# Patient Record
Sex: Female | Born: 1985 | Race: White | Hispanic: No | Marital: Married | Smoking: Never smoker
Health system: Southern US, Community
[De-identification: ages and names within clinical notes are randomized; demographics above are authoritative.]

## PROBLEM LIST (undated history)

## (undated) ENCOUNTER — Inpatient Hospital Stay (HOSPITAL_COMMUNITY): Payer: Self-pay

## (undated) DIAGNOSIS — O139 Gestational [pregnancy-induced] hypertension without significant proteinuria, unspecified trimester: Secondary | ICD-10-CM

## (undated) DIAGNOSIS — Z862 Personal history of diseases of the blood and blood-forming organs and certain disorders involving the immune mechanism: Secondary | ICD-10-CM

## (undated) DIAGNOSIS — E041 Nontoxic single thyroid nodule: Secondary | ICD-10-CM

## (undated) DIAGNOSIS — R87629 Unspecified abnormal cytological findings in specimens from vagina: Secondary | ICD-10-CM

## (undated) DIAGNOSIS — Q210 Ventricular septal defect: Secondary | ICD-10-CM

## (undated) DIAGNOSIS — Z8639 Personal history of other endocrine, nutritional and metabolic disease: Secondary | ICD-10-CM

## (undated) HISTORY — DX: Personal history of diseases of the blood and blood-forming organs and certain disorders involving the immune mechanism: Z86.2

## (undated) HISTORY — PX: TONSILLECTOMY: SHX5217

## (undated) HISTORY — PX: ANTERIOR CRUCIATE LIGAMENT REPAIR: SHX115

## (undated) HISTORY — DX: Unspecified abnormal cytological findings in specimens from vagina: R87.629

## (undated) HISTORY — DX: Personal history of other endocrine, nutritional and metabolic disease: Z86.39

## (undated) HISTORY — DX: Ventricular septal defect: Q21.0

## (undated) HISTORY — DX: Gestational (pregnancy-induced) hypertension without significant proteinuria, unspecified trimester: O13.9

## (undated) HISTORY — DX: Nontoxic single thyroid nodule: E04.1

---

## 2004-10-09 ENCOUNTER — Ambulatory Visit (HOSPITAL_COMMUNITY): Admission: RE | Admit: 2004-10-09 | Discharge: 2004-10-09 | Payer: Self-pay | Admitting: Neurosurgery

## 2005-01-31 ENCOUNTER — Emergency Department (HOSPITAL_COMMUNITY): Admission: EM | Admit: 2005-01-31 | Discharge: 2005-01-31 | Payer: Self-pay | Admitting: Emergency Medicine

## 2005-09-28 ENCOUNTER — Ambulatory Visit (HOSPITAL_COMMUNITY): Admission: RE | Admit: 2005-09-28 | Discharge: 2005-09-28 | Payer: Self-pay | Admitting: Neurosurgery

## 2005-11-09 ENCOUNTER — Ambulatory Visit (HOSPITAL_COMMUNITY): Admission: RE | Admit: 2005-11-09 | Discharge: 2005-11-09 | Payer: Self-pay | Admitting: Orthopedic Surgery

## 2007-05-29 ENCOUNTER — Encounter: Admission: RE | Admit: 2007-05-29 | Discharge: 2007-05-29 | Payer: Self-pay | Admitting: Endocrinology

## 2007-05-29 ENCOUNTER — Encounter (INDEPENDENT_AMBULATORY_CARE_PROVIDER_SITE_OTHER): Payer: Self-pay | Admitting: Interventional Radiology

## 2007-05-29 ENCOUNTER — Other Ambulatory Visit: Admission: RE | Admit: 2007-05-29 | Discharge: 2007-05-29 | Payer: Self-pay | Admitting: Interventional Radiology

## 2008-08-13 DIAGNOSIS — Z862 Personal history of diseases of the blood and blood-forming organs and certain disorders involving the immune mechanism: Secondary | ICD-10-CM

## 2008-08-13 HISTORY — DX: Personal history of diseases of the blood and blood-forming organs and certain disorders involving the immune mechanism: Z86.2

## 2008-09-29 ENCOUNTER — Encounter (INDEPENDENT_AMBULATORY_CARE_PROVIDER_SITE_OTHER): Payer: Self-pay | Admitting: *Deleted

## 2008-10-28 ENCOUNTER — Ambulatory Visit: Payer: Self-pay | Admitting: Internal Medicine

## 2008-10-28 DIAGNOSIS — Q210 Ventricular septal defect: Secondary | ICD-10-CM

## 2008-10-28 DIAGNOSIS — E041 Nontoxic single thyroid nodule: Secondary | ICD-10-CM

## 2008-10-28 DIAGNOSIS — J069 Acute upper respiratory infection, unspecified: Secondary | ICD-10-CM | POA: Insufficient documentation

## 2008-10-28 DIAGNOSIS — Z8639 Personal history of other endocrine, nutritional and metabolic disease: Secondary | ICD-10-CM

## 2008-10-28 DIAGNOSIS — Z862 Personal history of diseases of the blood and blood-forming organs and certain disorders involving the immune mechanism: Secondary | ICD-10-CM

## 2008-10-28 HISTORY — DX: Nontoxic single thyroid nodule: E04.1

## 2008-11-01 ENCOUNTER — Ambulatory Visit: Payer: Self-pay | Admitting: Internal Medicine

## 2008-11-03 ENCOUNTER — Encounter: Admission: RE | Admit: 2008-11-03 | Discharge: 2008-11-03 | Payer: Self-pay | Admitting: Internal Medicine

## 2008-11-04 ENCOUNTER — Encounter (INDEPENDENT_AMBULATORY_CARE_PROVIDER_SITE_OTHER): Payer: Self-pay | Admitting: *Deleted

## 2008-11-04 ENCOUNTER — Telehealth (INDEPENDENT_AMBULATORY_CARE_PROVIDER_SITE_OTHER): Payer: Self-pay | Admitting: *Deleted

## 2008-11-04 LAB — CONVERTED CEMR LAB
Free T4: 0.8 ng/dL (ref 0.6–1.6)
TSH: 0.28 microintl units/mL — ABNORMAL LOW (ref 0.35–5.50)

## 2009-09-19 ENCOUNTER — Ambulatory Visit: Payer: Self-pay | Admitting: Internal Medicine

## 2009-09-19 DIAGNOSIS — R5383 Other fatigue: Secondary | ICD-10-CM

## 2009-09-19 DIAGNOSIS — R5381 Other malaise: Secondary | ICD-10-CM

## 2009-09-19 DIAGNOSIS — R0602 Shortness of breath: Secondary | ICD-10-CM

## 2009-09-20 LAB — CONVERTED CEMR LAB
ALT: 21 units/L (ref 0–35)
BUN: 9 mg/dL (ref 6–23)
Basophils Absolute: 0 10*3/uL (ref 0.0–0.1)
Bilirubin, Direct: 0.2 mg/dL (ref 0.0–0.3)
Creatinine, Ser: 0.8 mg/dL (ref 0.4–1.2)
EBV NA IgG: 2.02 — ABNORMAL HIGH
EBV VCA IgG: 6.55 — ABNORMAL HIGH
Eosinophils Relative: 0.2 % (ref 0.0–5.0)
GFR calc non Af Amer: 93.08 mL/min (ref >60)
Glucose, Bld: 95 mg/dL (ref 70–99)
Lymphs Abs: 2.2 10*3/uL (ref 0.7–4.0)
MCV: 92.9 fL (ref 78.0–100.0)
Monocytes Absolute: 0.8 10*3/uL (ref 0.1–1.0)
Monocytes Relative: 6.5 % (ref 3.0–12.0)
Neutrophils Relative %: 74.2 % (ref 43.0–77.0)
Platelets: 193 10*3/uL (ref 150.0–400.0)
RDW: 12.4 % (ref 11.5–14.6)
TSH: 0.45 microintl units/mL (ref 0.35–5.50)
Total Bilirubin: 0.9 mg/dL (ref 0.3–1.2)
WBC: 11.9 10*3/uL — ABNORMAL HIGH (ref 4.5–10.5)

## 2009-09-22 ENCOUNTER — Telehealth: Payer: Self-pay | Admitting: Internal Medicine

## 2010-02-20 ENCOUNTER — Ambulatory Visit: Payer: Self-pay | Admitting: Internal Medicine

## 2010-02-22 ENCOUNTER — Encounter (INDEPENDENT_AMBULATORY_CARE_PROVIDER_SITE_OTHER): Payer: Self-pay | Admitting: *Deleted

## 2010-09-03 ENCOUNTER — Encounter: Payer: Self-pay | Admitting: Endocrinology

## 2010-09-12 NOTE — Letter (Signed)
Summary: TB Skin Test  All     ,     Phone:   Fax:           TB Skin Test    Amy Brock    Date TB Test Placed:  Monday July 11th 2011  L Forearm  TB Test Placed by:  Margaret Pyle, CMA   Lot #:  [id number]        Expiration Date: 01/08/2012  Date TB Test Read: Wednesday July 13th 2011  Result < 5 MM  Negative  TB Test Read by:  Margaret Pyle, CMA

## 2010-09-12 NOTE — Assessment & Plan Note (Signed)
Summary: new - OK DR Aparna Vanderweele to add on/[facility]cd   Vital Signs:  Patient profile:   25 year old female Height:      66.75 inches (169.55 cm) Weight:      143.0 pounds (65 kg) BMI:     22.65 O2 Sat:      97 % on Room air Temp:     98.7 degrees F (37.06 degrees C) oral Pulse rate:   54 / minute BP sitting:   122 / 74  (right arm) Cuff size:   regular  Vitals Entered By: Orlan Leavens, RMA  O2 Flow:  Room air CC: New patient Is Patient Diabetic? No Pain Assessment Patient in pain? no        Primary Care Provider:  Newt Lukes MD  CC:  New patient.  History of Present Illness: new pt to me - prev followed by my colleuge dr. hopper at [facility]  here today with complaint of fatigue and persisting illness. onset of symptoms was approx 3 weeks ago. course has been gradual  onset and now occurs in constant pattern. problem precipitated by ST on MLK day - seen at [facility] - started on Amox for +rapid strep symptom characterized as fatigue and congestion in head - problem associated with dry cough and SOB (since being hit in anterior ribs playing soccer last week) but not associated with fever, weight changes, sweats; also noted red "blotching" various times of the day involving head/neck and chest and arms since becoming ill - no prior flushing hx symptoms improved by nothing - [facility] week and agin tested + rapid strep so now on omnicef + pred pak symptoms worsened with exertion. no prior hx of same symptoms - concerned she may have mono or pneumonai and wants to be tested.   also ?if thyroiditis could be flaring up.... hx same dx by endo with aspiration and bx but hads never f/u with endo since that time - no change in neck swelling - no trouble swallowing but feels ache from ears down to front of throat-  Clinical Review Panels:  Immunizations   Last Tetanus Booster:  Historical (08/13/2005)   Last Flu Vaccine:  Historical  (07/13/2008)   Current Medications (verified): 1)  Prednisone (Pak) 5 Mg Tabs (Prednisone) .... Take As Directed 2)  Proair Hfa 108 (90 Base) Mcg/act Aers (Albuterol Sulfate) .... Take 1 Puff Q 4 Hours 3)  Antibiotic .... Take 1 Two Times A Day  Allergies (verified): No Known Drug Allergies  Past History:  Past Medical History: Thyroid cyst ,S/P aspiration : Thyroiditis  MD rooster:  endo Talmage Nap  Past Surgical History: Reviewed history from 10/28/2008 and no changes required. 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  The patient denies anorexia, fever, weight loss, hoarseness, syncope, and abdominal pain.    Physical Exam  General:  alert, well-developed, well-nourished, and cooperative to examination.   mom at side Eyes:  vision grossly intact; pupils equal, round and reactive to light.  conjunctiva and lids normal.    Ears:  normal pinnae bilaterally, without erythema, swelling, or tenderness to palpation. TMs clear, without effusion, or cerumen impaction. Hearing grossly normal  bilaterally  Mouth:  teeth and gums in good repair; mucous membranes moist, without lesions or ulcers. oropharynx clear without exudate, min erythema. min clear PND Neck:  R thyroid enlarged & smooth ; L thyroid small & slightly irregular Lungs:  normal respiratory effort, no intercostal retractions or use of accessory muscles; normal breath sounds bilaterally - no crackles and no wheezes.    Heart:  normal rate, regular rhythm, no murmur, and no rub. BLE without edema. normal DP pulses and normal cap refill in all 4 extremities    Abdomen:  soft, non-tender, normal bowel sounds, no distention; no masses and no appreciable  hepatomegaly or splenomegaly.   Skin:  no bruising or rash along right ribs at area of trauma - progressive bright red  "splotching" with cont interview and exam at ant chest and neck -    Impression & Recommendations:  Problem # 1:  FATIGUE (ICD-780.79) ?persisting strep infx - mouth/OP fairly benign looking today -  also afeb and on braod spect abx so will not send for swab cx at this time but consider if recurrent symptoms - pt/parent concerned with mono of PNA - check labs and tests now - also ?relation to thyroid abn - check labs and refer back to endo for mgmt of same- if persiting symptoms , consdier need for other testing depending on these lab tests and course - rec completing abx and pred at this time as already begun Orders: TLB-BMP (Basic Metabolic Panel-BMET) (80048-METABOL) TLB-CBC Platelet - w/Differential (85025-CBCD) TLB-Hepatic/Liver Function Pnl (80076-HEPATIC) TLB-TSH (Thyroid Stimulating Hormone) (84443-TSH) T-Epstein Barr Virus Antibody Panel I ([id number]) T-2 View CXR (71020TC)  Problem # 2:  DYSPNEA (ICD-786.05) exam bening and vs/o2 ok -  suspect rib contusion from direct soccer trauma - but check CXR now Orders: TLB-BMP (Basic Metabolic Panel-BMET) (80048-METABOL) TLB-CBC Platelet - w/Differential (85025-CBCD) TLB-Hepatic/Liver Function Pnl (80076-HEPATIC) TLB-TSH (Thyroid Stimulating Hormone) (84443-TSH) T-Epstein Barr Virus Antibody Panel I ([id number]) T-2 View CXR (71020TC)  Problem # 3:  THYROID CYST (ICD-246.2)  Orders: TLB-BMP (Basic Metabolic Panel-BMET) (80048-METABOL) TLB-CBC Platelet - w/Differential (85025-CBCD) TLB-Hepatic/Liver Function Pnl (80076-HEPATIC) TLB-TSH (Thyroid Stimulating Hormone) (84443-TSH) T-Epstein Barr Virus Antibody Panel I ([id number]) T-2 View CXR (71020TC) Endocrinology Referral (Endocrine)  Complete Medication List: 1)  Prednisone (pak) 5 Mg Tabs (Prednisone) .... Take as directed 2)  Proair Hfa 108 (90  Base) Mcg/act Aers (Albuterol sulfate) .... Take 1 puff q 4 hours 3)  Antibiotic  .... Take 1 two times a day  Patient Instructions: 1)  it was good to see you today.  2)  test(s) ordered today - your results will be posted on the phone tree for review in 48-72 hours from the time of test completion; call [phone] and enter your 9 digit MRN (listed above on this page, just below your name); if any changes need to be made or there are abnormal results, you will be contacted directly.  3)  we'll make referral to dr. Talmage Nap for thyroid followup. Our office will contact you regarding this appointment once made.  4)  complete current medications as ongoing and as discussed - no changes recommended today 5)  Please schedule a follow-up appointment in 4-6 weeks if continued fatigue, sooner if problems.    Immunization History:  Tetanus/Td Immunization History:    Tetanus/Td:  historical (08/13/2005)  Influenza Immunization History:    Influenza:  historical (07/13/2008)

## 2010-09-12 NOTE — Progress Notes (Signed)
Summary: pt? re: pain  Phone Note Call from Patient Call back at Home Phone [phone]   Caller: Patient Summary of Call: pt called stating that she is still having back and chest pain although her labs all come back normal. pt is requesting advise from MD as to next step, possible referral? Initial call taken by: Margaret Pyle, CMA,  September 22, 2009 9:25 AM  Follow-up for Phone Call        i called pt and reviewed her symptoms by phone: continued right side chest and rib pain, esp with deep inspiration, not with movement - occ worse woith food -- pt concerned about abx upsetting stomach and about possible pleurisy - reviewed again neg CXR and labs - at this time, stop any remaining Omnicef (abx) and start Mobic once daily x 7d,,then as needed pain (e-rx done) - pt to call if cont symptoms despite this and will consider ?CT chest angio for further eval -- no need for specialist referral at this time - pt understands and agrees Follow-up by: Newt Lukes MD,  September 22, 2009 11:38 AM    New/Updated Medications: MOBIC 15 MG TABS (MELOXICAM) 1 by mouth once daily x 7 days, then as needed for pain Prescriptions: MOBIC 15 MG TABS (MELOXICAM) 1 by mouth once daily x 7 days, then as needed for pain  #30 x 0   Entered and Authorized by:   Newt Lukes MD   Signed by:   Newt Lukes MD on 09/22/2009   Method used:   Electronically to        [id number] (retail)       [address]       [place], [place]  [address]       Ph: [phone] or [phone]       Fax: [phone]   RxID:   [id number]

## 2010-09-12 NOTE — Assessment & Plan Note (Signed)
Summary: TB SKIN TEST--VL--STC  Nurse Visit   Allergies: No Known Drug Allergies  Immunizations Administered:  PPD Skin Test:    Vaccine Type: PPD    Site: left forearm    Mfr: Sanofi Pasteur    Dose: 0.1 ml    Route: ID    Given by: Margaret Pyle, CMA    Exp. Date: 01/08/2012    Lot #: [id number]  PPD Results    Date of reading: 02/22/2010    Results: < 5mm    Interpretation: negative  Orders Added: 1)  TB Skin Test [86580] 2)  Admin 1st Vaccine [45409]

## 2011-01-23 IMAGING — US US SOFT TISSUE HEAD/NECK
1 series · 14 of 25 positions shown · non-contrast
Comparison: To prior dictated report from ultrasound of the thyroid
from [HOSPITAL] dated 05/09/2007

CLINICAL DATA: Follow up of thyroid nodule

THYROID ULTRASOUND
TECHNIQUE: Ultrasound examination of the thyroid gland and
adjacent soft tissues was performed.

[Series 1: us soft tissue head/neck · 0.05mm/px · 14 of 51 slices shown]
[im 1/51]
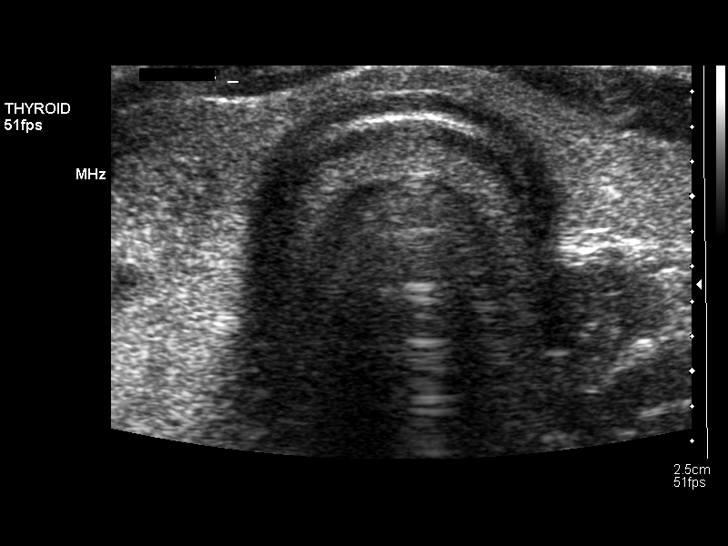
[im 5/51]
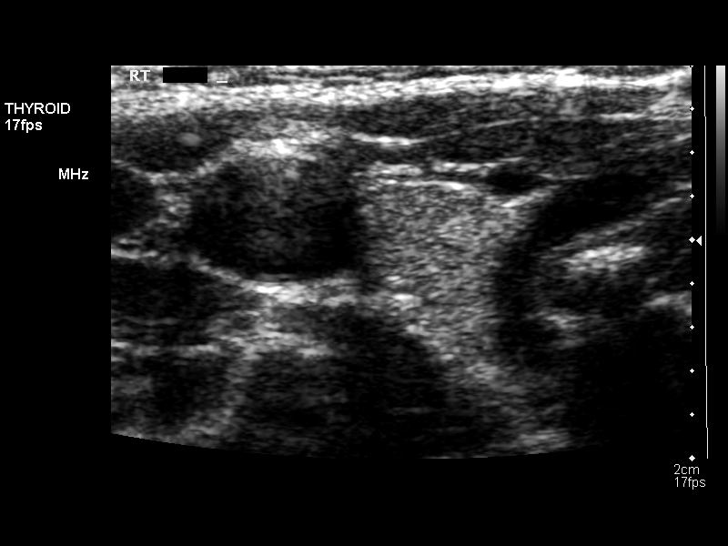
[im 9/51]
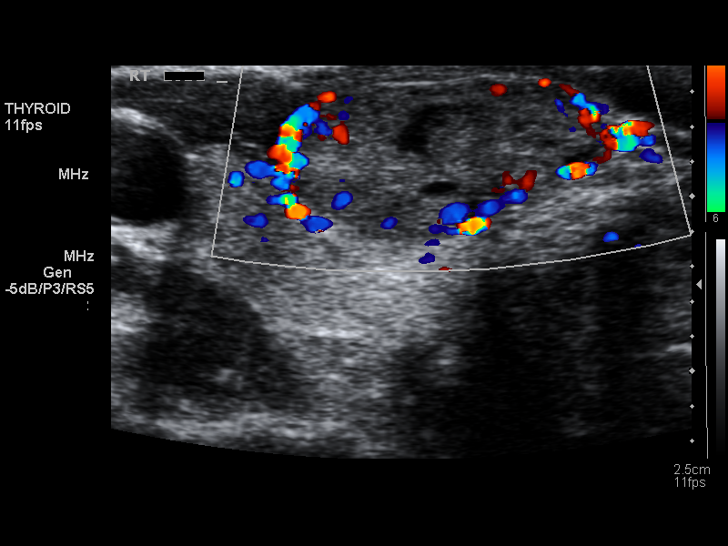
[im 13/51]
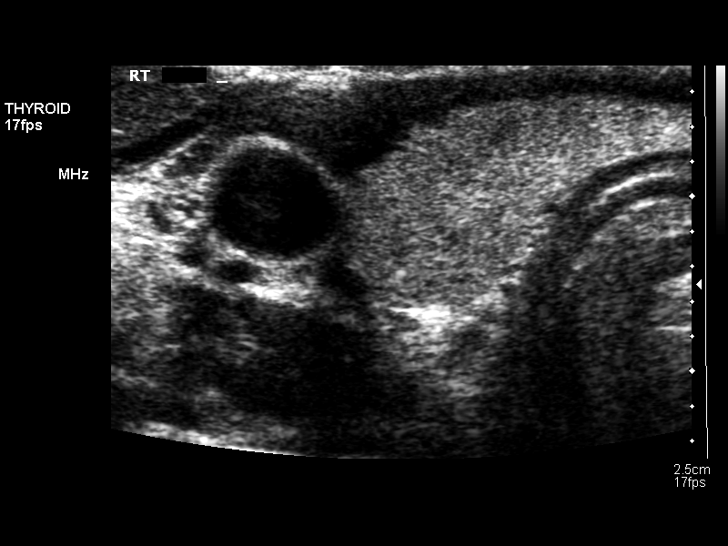
[im 17/51]
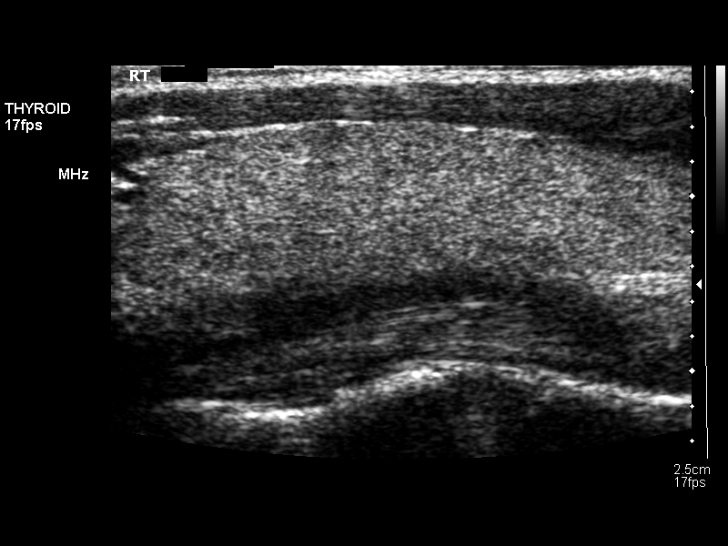
[im 19/51]
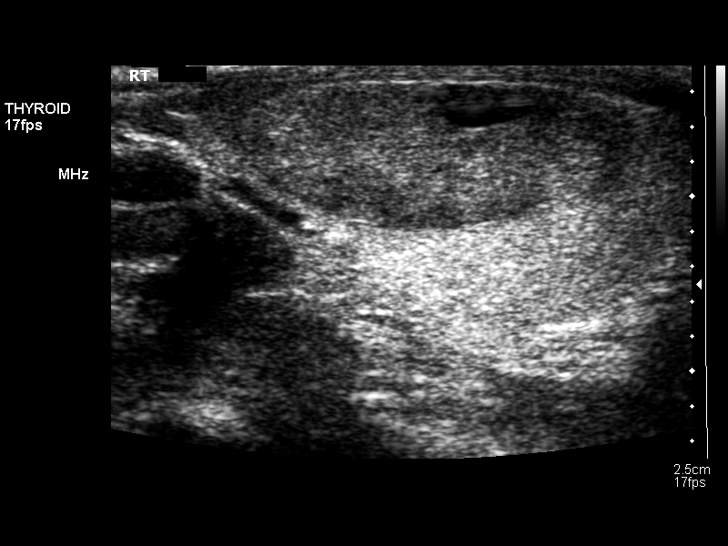
[im 23/51]
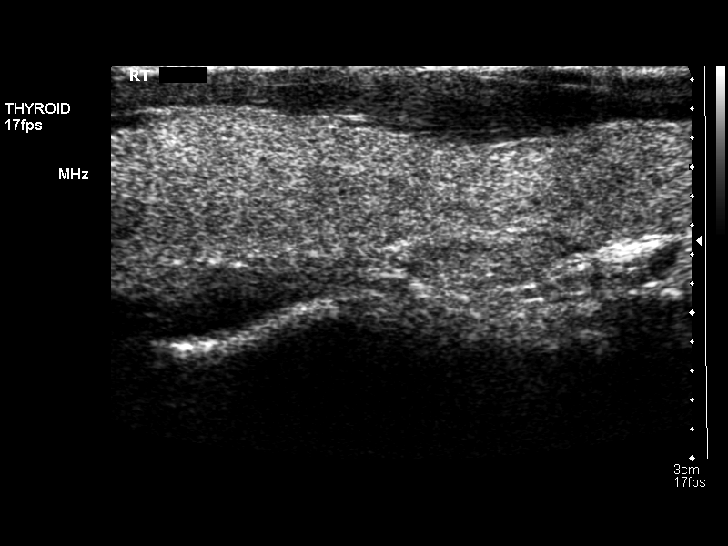
[im 28/51]
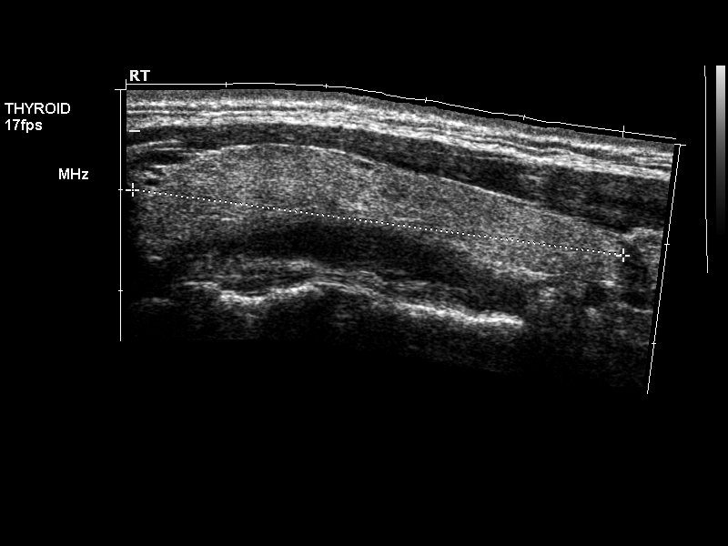
[im 32/51]
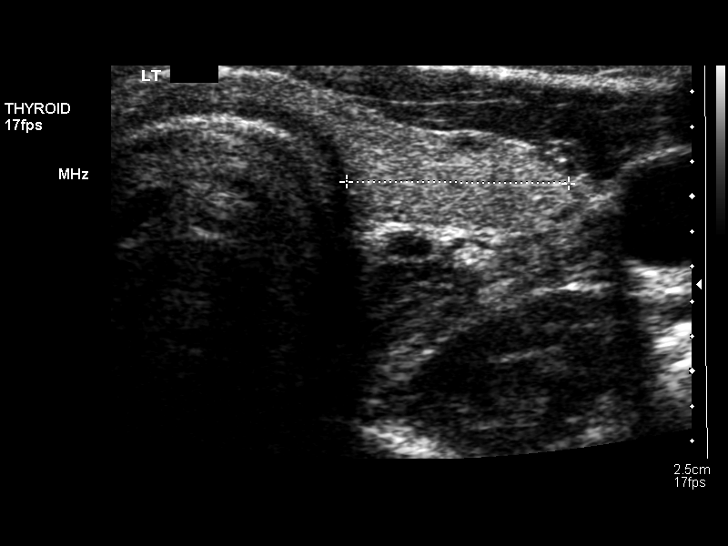
[im 34/51]
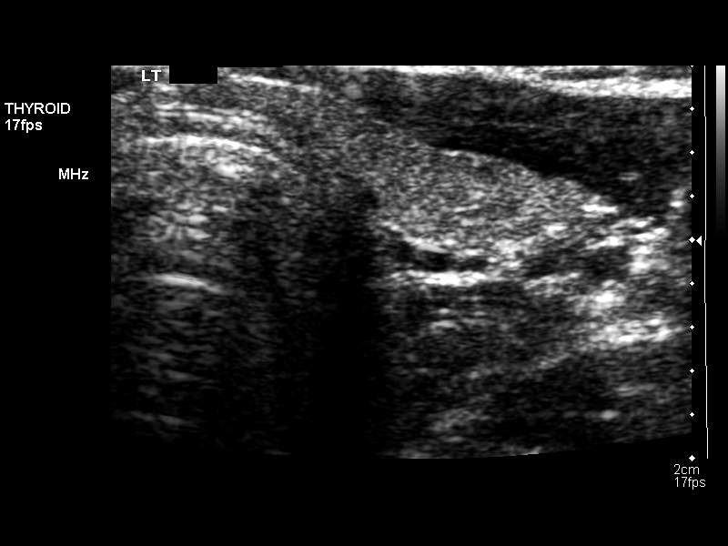
[im 38/51]
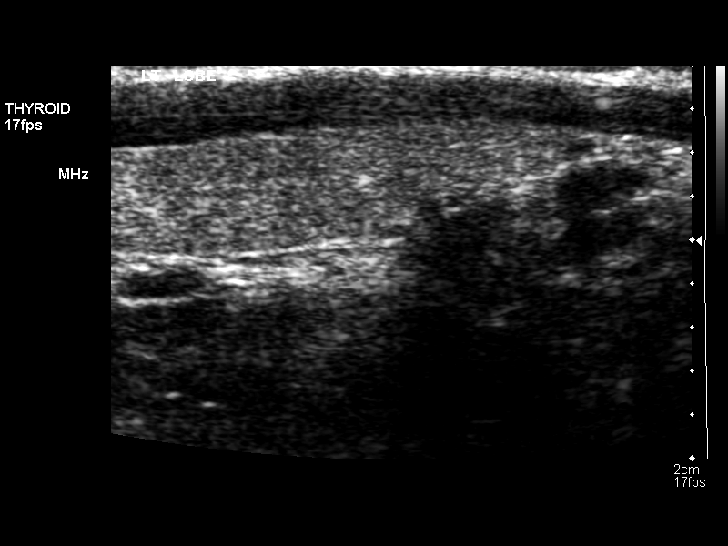
[im 42/51]
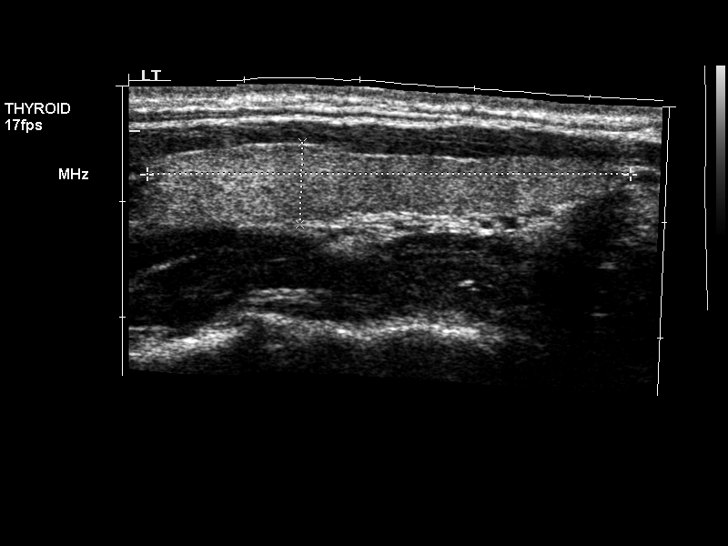
[im 46/51]
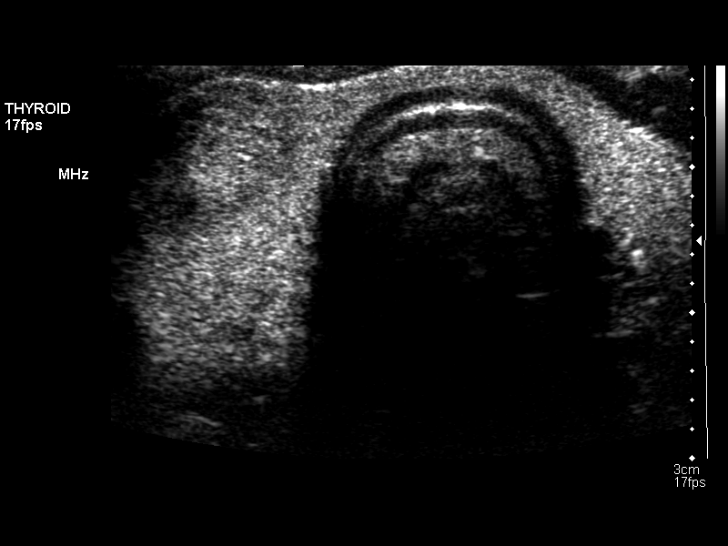
[im 51/51]
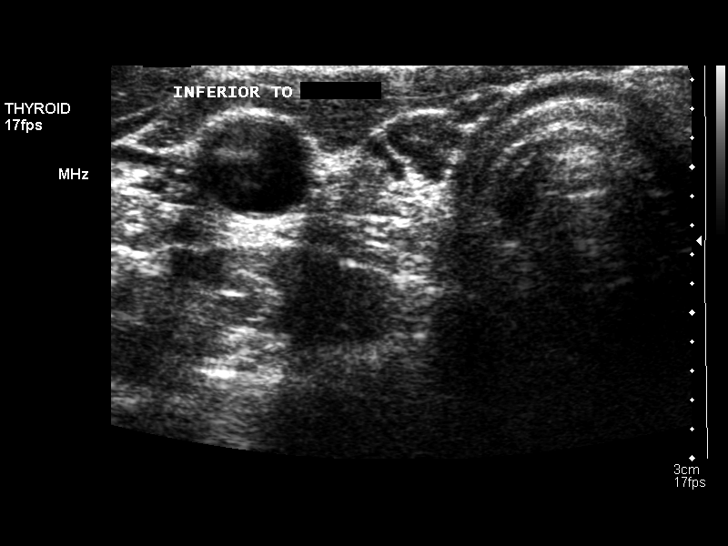

[14 of 25 positions shown; findings below may reference images not displayed]

FINDINGS: The thyroid gland is stable in size.  The right lobe
currently measures 4.9 x 1.0 x 1.9 cm, compared to prior
measurements of 4.9 x 1.2 x 2.0 cm.  The left lobe now measures
x 0.7 x 1.3 cm, compared to prior measurement of 4.4 x 0.7 x
cm.  The isthmus measures 1.8 mm in thickness.  The gland is
homogeneous in echogenicity.  A solid nodule is noted within the
right medial thyroid near the isthmus which is solid measuring
x 0.9 x 2.0 cm.  Prior measurements of this solid nodule were 1.8 x
1.0 x 2.3 cm.  Only a tiny 3 mm solid nodule is noted in the left
mid lobe.  A probable lymph node is noted just inferior to the
right lobe.
IMPRESSION: Stable solid nodule on the right of 2.4 x 0.9 x 2.0 cm.

## 2011-02-02 ENCOUNTER — Encounter: Payer: Self-pay | Admitting: Internal Medicine

## 2011-02-05 ENCOUNTER — Other Ambulatory Visit (INDEPENDENT_AMBULATORY_CARE_PROVIDER_SITE_OTHER): Payer: 59

## 2011-02-05 ENCOUNTER — Encounter: Payer: Self-pay | Admitting: Internal Medicine

## 2011-02-05 ENCOUNTER — Ambulatory Visit (INDEPENDENT_AMBULATORY_CARE_PROVIDER_SITE_OTHER): Payer: 59 | Admitting: Internal Medicine

## 2011-02-05 ENCOUNTER — Other Ambulatory Visit (INDEPENDENT_AMBULATORY_CARE_PROVIDER_SITE_OTHER): Payer: 59 | Admitting: Internal Medicine

## 2011-02-05 VITALS — BP 110/72 | HR 53 | Temp 98.5°F | Ht 66.75 in | Wt 145.0 lb

## 2011-02-05 DIAGNOSIS — Z0000 Encounter for general adult medical examination without abnormal findings: Secondary | ICD-10-CM

## 2011-02-05 DIAGNOSIS — N979 Female infertility, unspecified: Secondary | ICD-10-CM

## 2011-02-05 LAB — TSH: TSH: 0.97 u[IU]/mL (ref 0.35–5.50)

## 2011-02-05 LAB — LIPID PANEL
Cholesterol: 145 mg/dL (ref 0–200)
VLDL: 12.6 mg/dL (ref 0.0–40.0)

## 2011-02-05 LAB — BASIC METABOLIC PANEL
BUN: 10 mg/dL (ref 6–23)
Calcium: 8.6 mg/dL (ref 8.4–10.5)
Creatinine, Ser: 0.8 mg/dL (ref 0.4–1.2)
GFR: 90.75 mL/min (ref >60.00)
Glucose, Bld: 85 mg/dL (ref 70–99)

## 2011-02-05 LAB — URINALYSIS, ROUTINE W REFLEX MICROSCOPIC
Bilirubin Urine: NEGATIVE
Leukocytes, UA: NEGATIVE
Nitrite: NEGATIVE
pH: 6 (ref 5.0–8.0)

## 2011-02-05 LAB — CBC WITH DIFFERENTIAL/PLATELET
Basophils Absolute: 0 10*3/uL (ref 0.0–0.1)
Eosinophils Relative: 0.9 % (ref 0.0–5.0)
HCT: 36.4 % (ref 36.0–46.0)
Lymphs Abs: 1.1 10*3/uL (ref 0.7–4.0)
Monocytes Relative: 10.7 % (ref 3.0–12.0)
Neutrophils Relative %: 64.7 % (ref 43.0–77.0)
Platelets: 136 10*3/uL — ABNORMAL LOW (ref 150.0–400.0)
RDW: 13.2 % (ref 11.5–14.6)
WBC: 4.9 10*3/uL (ref 4.5–10.5)

## 2011-02-05 LAB — HEPATIC FUNCTION PANEL
ALT: 12 U/L (ref 0–35)
AST: 18 U/L (ref 0–37)
Alkaline Phosphatase: 37 U/L — ABNORMAL LOW (ref 39–117)
Bilirubin, Direct: 0.1 mg/dL (ref 0.0–0.3)
Total Bilirubin: 0.6 mg/dL (ref 0.3–1.2)
Total Protein: 6.8 g/dL (ref 6.0–8.3)

## 2011-02-05 MED ORDER — TUBERCULIN PPD 5 UNIT/0.1ML ID SOLN
5.0000 [IU] | Freq: Once | INTRADERMAL | Status: AC
  Administered 2011-02-05: 5 [IU] via INTRADERMAL

## 2011-02-05 NOTE — Patient Instructions (Signed)
It was good to see you today. We have reviewed your prior records including immunizations, labs and tests today Paperwork for school done today - will be ready for pick up Wednesday when you return for your PPD reading Test(s) ordered today for physical and AMH as requested. Your results will be given to you Wednesday (or called to you after review). If any changes need to be made, you will be notified at that time. Please schedule followup annually, call sooner if problems.

## 2011-02-05 NOTE — Progress Notes (Signed)
Subjective:    Patient ID: Amy Brock, female    DOB: May 25, 1986, 25 y.o.   MRN: [id number]  HPI patient is here today for annual physical. Patient feels well and has no complaints.  ?about best reproductive age - rec last week by gyn to check "AMH" level  Past Medical History  Diagnosis Date  . THYROIDITIS, HX OF 2010  . VSD     congenital, closed spont  . THYROID CYST 10/28/2008   Family History  Problem Relation Age of Onset  . Breast cancer Maternal Aunt   . Hypertension Maternal Grandmother     MI in 93's & CHF  . Hypertension Maternal Grandfather     ? MI @ 85  . Hypertension Paternal Grandfather     MI in 53's & CHF   History  Substance Use Topics  . Smoking status: Never Smoker   . 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 Alcohol Use: Yes     occassional    Review of Systems  Constitutional: Negative for fever.  Respiratory: Negative for cough and shortness of breath.   Cardiovascular: Negative for chest pain.  Gastrointestinal: Negative for abdominal pain.  Musculoskeletal: Negative for gait problem.  Skin: Negative for rash.  Neurological: Negative for dizziness.  No other specific complaints in a complete review of systems (except as listed in HPI above).     Objective:   Physical Exam BP 110/72  Pulse 53  Temp(Src) 98.5 F (36.9 C) (Oral)  Ht 5' 6.75" (1.695 m)  Wt 145 lb (65.772 kg)  BMI 22.88 kg/m2  SpO2 99% Wt Readings from Last 3 Encounters:  02/05/11 145 lb (65.772 kg)  09/19/09 143 lb (64.864 kg)  10/28/08 143 lb 6.4 oz (65.046 kg)    Physical Exam  Constitutional: She is oriented to person, place, and time. She appears well-developed and well-nourished. No distress.  HENT: Head: Normocephalic and atraumatic. Ears; B TMs ok, no erythema or effusion; Nose: Nose normal.  Mouth/Throat: Oropharynx is clear and moist. No oropharyngeal exudate.  Eyes: Conjunctivae and EOM are normal. Pupils are equal, round,  and reactive to light. No scleral icterus.  Neck: Normal range of motion. Neck supple. No JVD present. No thyromegaly present.  Cardiovascular: Normal rate, regular rhythm and normal heart sounds.  No murmur heard. No BLE edema. Pulmonary/Chest: Effort normal and breath sounds normal. No respiratory distress. She has no wheezes.  Abdominal: Soft. Bowel sounds are normal. She exhibits no distension. There is no tenderness.  Musculoskeletal: Normal range of motion, no joint effusions. No gross deformities Neurological: She is alert and oriented to person, place, and time. No cranial nerve deficit. Coordination normal.  Skin: Skin is warm and dry. No rash noted. No erythema.  Psychiatric: She has a normal mood and affect. Her behavior is normal. Judgment and thought content normal.       Lab Results  Component Value Date   WBC 11.9* 09/19/2009   HGB 13.4 09/19/2009   HCT 39.8 09/19/2009   PLT 193.0 09/19/2009   ALT 21 09/19/2009   AST 22 09/19/2009   NA 141 09/19/2009   K 3.6 09/19/2009   CL 106 09/19/2009   CREATININE 0.8 09/19/2009   BUN 9 09/19/2009   CO2 30 09/19/2009   TSH 0.45 09/19/2009    Assessment & Plan:  CPX - v70.0 - Patient has been counseled on age-appropriate routine health concerns for screening and prevention. These are reviewed and  up-to-date. Immunizations are up-to-date or declined. Labs ordered - will be reviewed. Paperwork for school (OT at [facility]) completed to pick up Wed s/p PPD reading  "infertility" - not yet trying to concienve but older sister with difficulty conceiving after age 26 - rec by gyn to check AMH (antimullerain hormone) to determine "best reproductive years"

## 2011-02-07 NOTE — Progress Notes (Signed)
Addended by: Anselm Jungling on: 02/07/2011 09:48 AM   Modules accepted: Orders

## 2011-12-09 IMAGING — CR DG CHEST 2V
2 series · 2 of 2 positions shown · non-contrast
Comparison: None

CLINICAL DATA: Chest pain, cough, shortness of breath

CHEST - 2 VIEW

[view not recorded (1 of 2)]
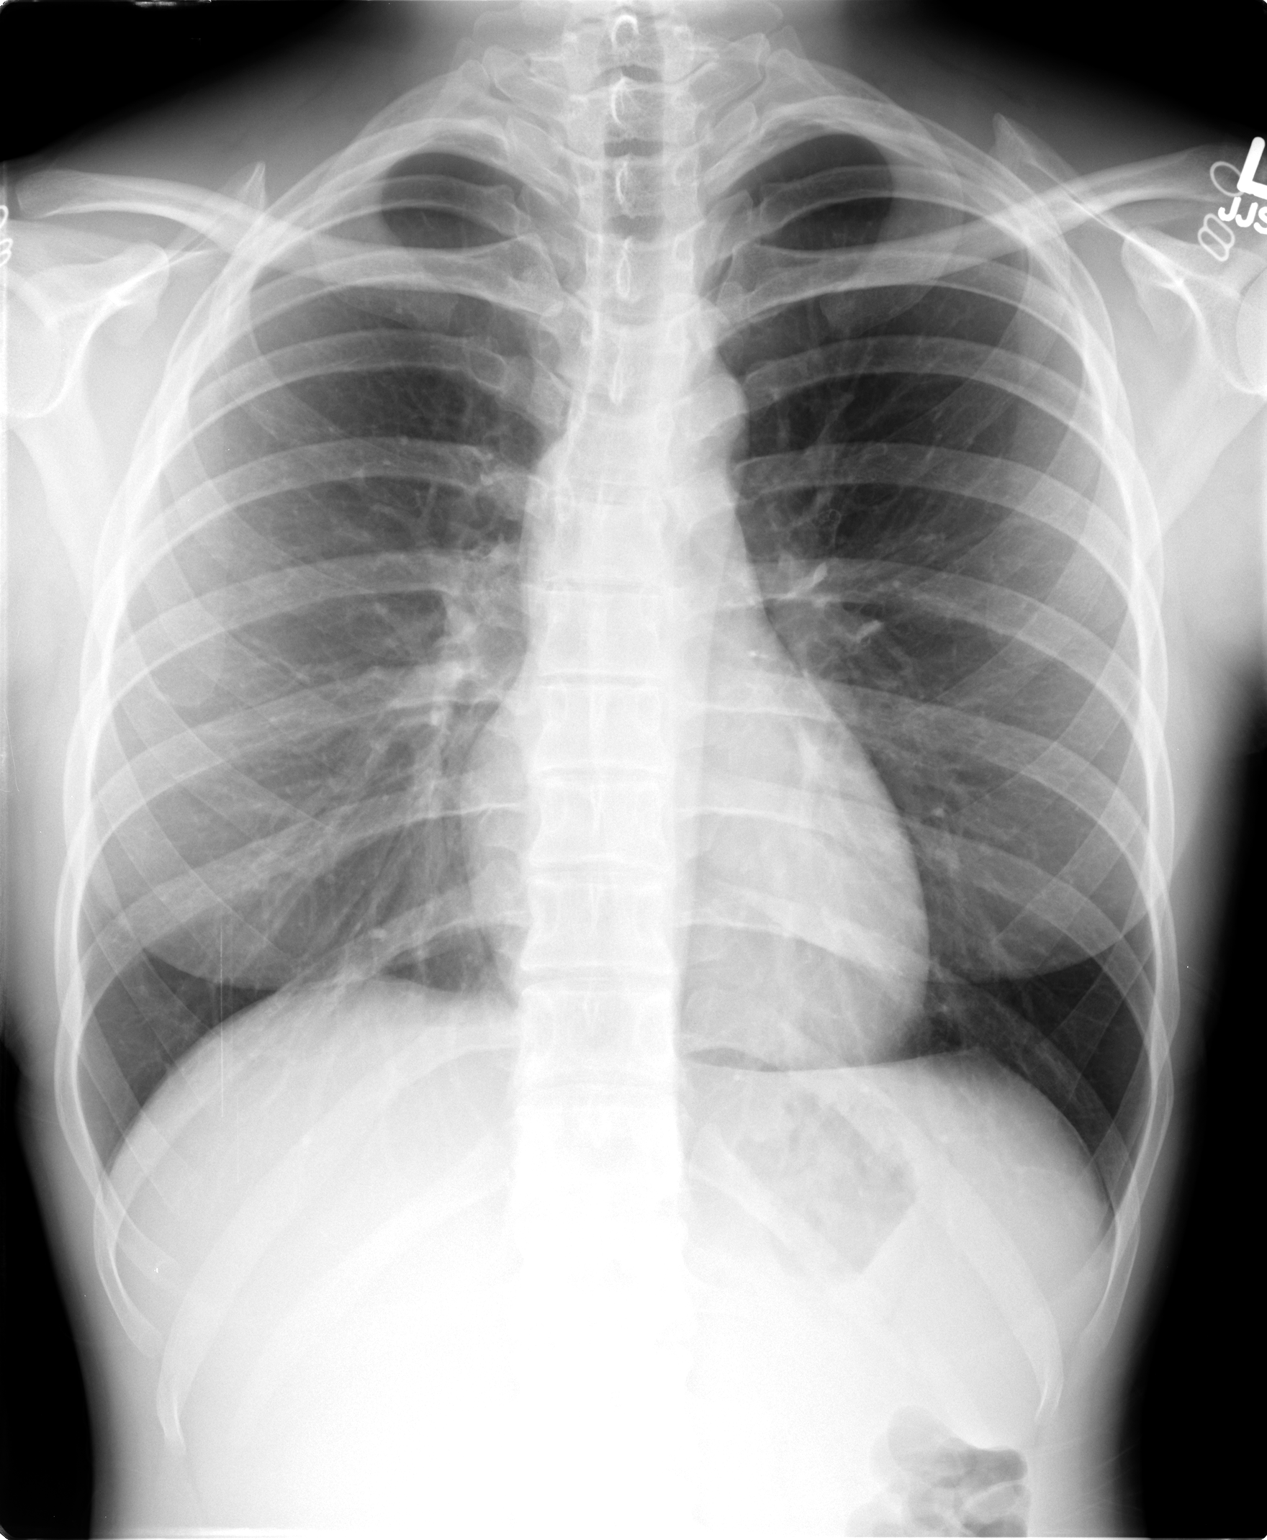

[view not recorded (2 of 2)]
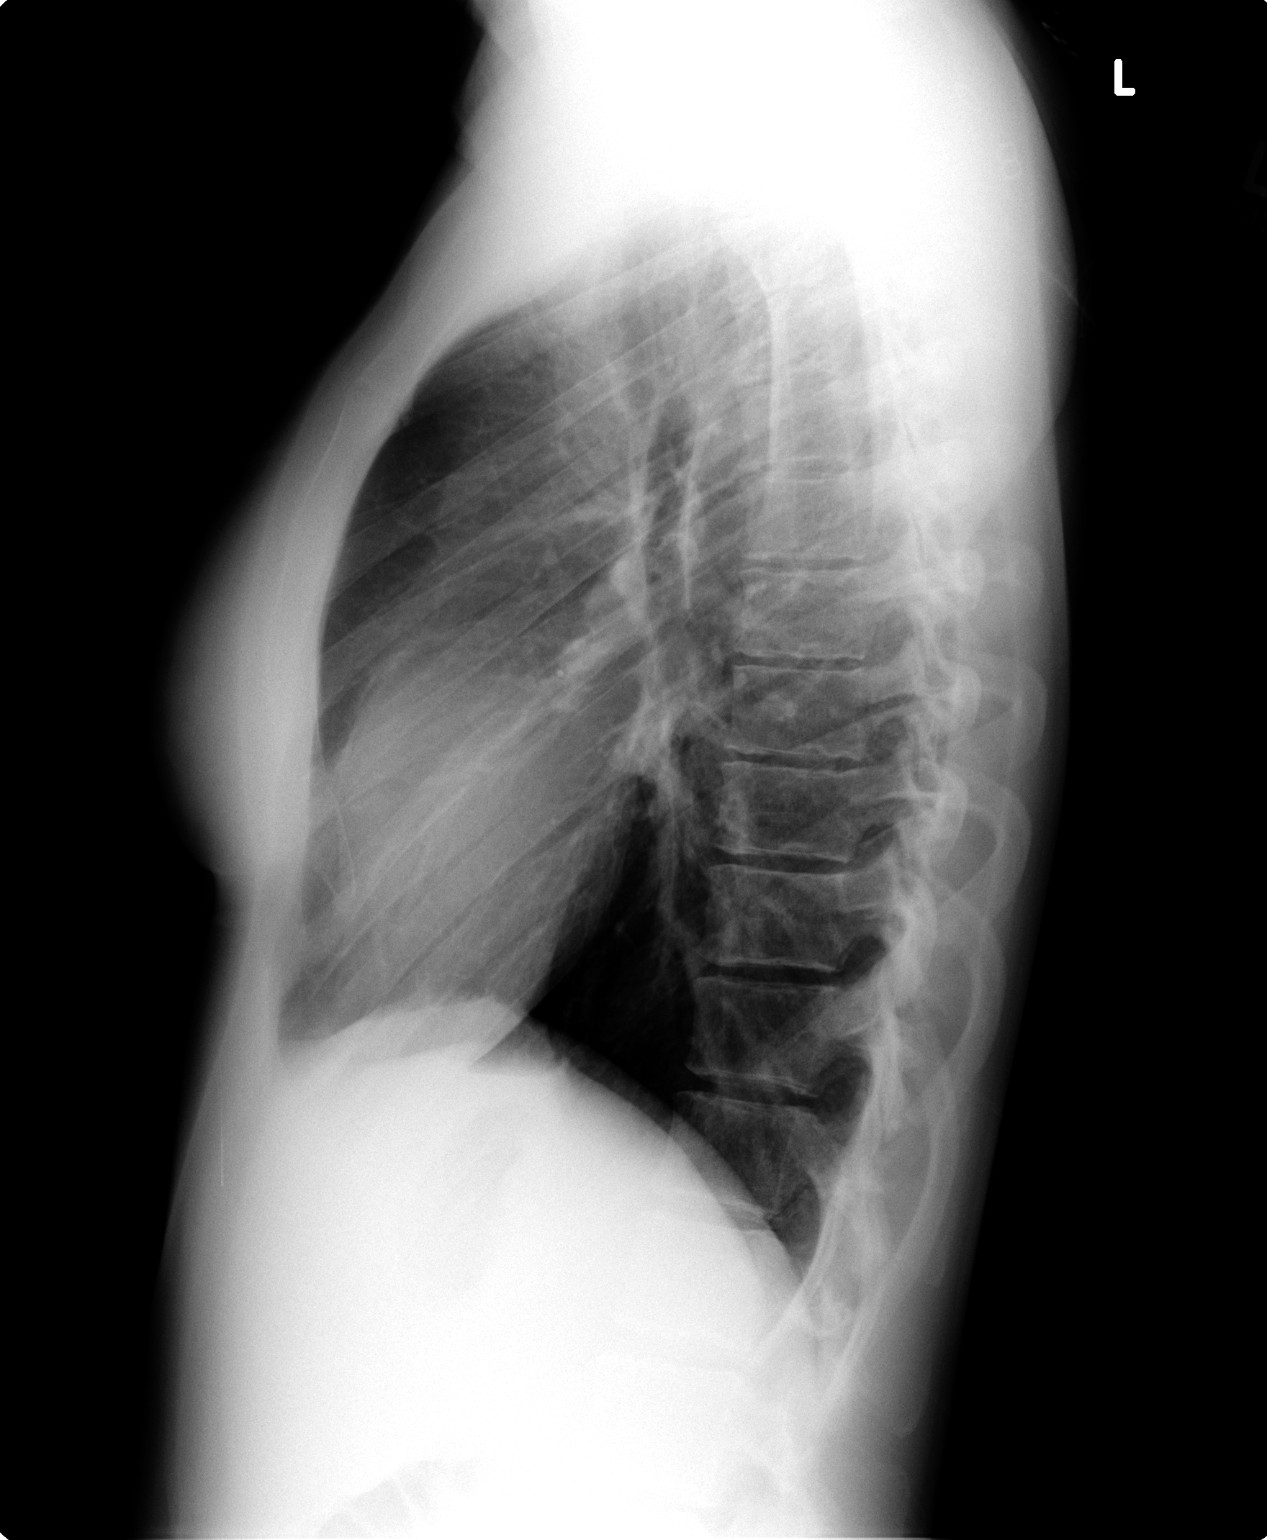

[2 of 2 positions shown; findings below may reference images not displayed]

FINDINGS: The lungs are clear.  Mediastinal contours are normal.
The heart is within normal limits in size.  No bony abnormality is
seen.
IMPRESSION: No active lung disease.

## 2012-04-10 ENCOUNTER — Other Ambulatory Visit: Payer: Self-pay | Admitting: Obstetrics and Gynecology

## 2013-04-22 ENCOUNTER — Other Ambulatory Visit: Payer: Self-pay | Admitting: Obstetrics and Gynecology

## 2014-02-09 LAB — OB RESULTS CONSOLE RUBELLA ANTIBODY, IGM: Rubella: IMMUNE

## 2014-02-09 LAB — OB RESULTS CONSOLE ABO/RH: RH TYPE: NEGATIVE

## 2014-02-09 LAB — OB RESULTS CONSOLE GC/CHLAMYDIA
Chlamydia: NEGATIVE
GC PROBE AMP, GENITAL: NEGATIVE

## 2014-02-09 LAB — OB RESULTS CONSOLE ANTIBODY SCREEN: Antibody Screen: NEGATIVE

## 2014-02-09 LAB — OB RESULTS CONSOLE HEPATITIS B SURFACE ANTIGEN: Hepatitis B Surface Ag: NEGATIVE

## 2014-02-09 LAB — OB RESULTS CONSOLE HIV ANTIBODY (ROUTINE TESTING): HIV: NONREACTIVE

## 2014-02-09 LAB — OB RESULTS CONSOLE RPR: RPR: NONREACTIVE

## 2014-02-22 ENCOUNTER — Other Ambulatory Visit: Payer: Self-pay | Admitting: Obstetrics and Gynecology

## 2014-02-23 LAB — CYTOLOGY - PAP

## 2014-06-13 ENCOUNTER — Inpatient Hospital Stay (HOSPITAL_COMMUNITY): Admission: AD | Admit: 2014-06-13 | Payer: 59 | Source: Ambulatory Visit | Admitting: Obstetrics and Gynecology

## 2014-08-16 LAB — OB RESULTS CONSOLE GBS: GBS: POSITIVE

## 2014-08-29 ENCOUNTER — Encounter (HOSPITAL_COMMUNITY): Payer: Self-pay | Admitting: *Deleted

## 2014-08-29 ENCOUNTER — Inpatient Hospital Stay (HOSPITAL_COMMUNITY)
Admission: AD | Admit: 2014-08-29 | Discharge: 2014-08-29 | Disposition: A | Discharge status: Home / Self Care | Payer: BLUE CROSS/BLUE SHIELD | Source: Ambulatory Visit | Attending: Obstetrics and Gynecology | Admitting: Obstetrics and Gynecology

## 2014-08-29 DIAGNOSIS — O9989 Other specified diseases and conditions complicating pregnancy, childbirth and the puerperium: Secondary | ICD-10-CM | POA: Diagnosis present

## 2014-08-29 DIAGNOSIS — N898 Other specified noninflammatory disorders of vagina: Secondary | ICD-10-CM | POA: Insufficient documentation

## 2014-08-29 DIAGNOSIS — Z3A38 38 weeks gestation of pregnancy: Secondary | ICD-10-CM | POA: Insufficient documentation

## 2014-08-29 LAB — URINE MICROSCOPIC-ADD ON

## 2014-08-29 LAB — URINALYSIS, ROUTINE W REFLEX MICROSCOPIC
BILIRUBIN URINE: NEGATIVE
GLUCOSE, UA: NEGATIVE mg/dL
HGB URINE DIPSTICK: NEGATIVE
Ketones, ur: NEGATIVE mg/dL
NITRITE: NEGATIVE
Protein, ur: NEGATIVE mg/dL
Specific Gravity, Urine: 1.01 (ref 1.005–1.030)
UROBILINOGEN UA: 0.2 mg/dL (ref 0.0–1.0)
pH: 6.5 (ref 5.0–8.0)

## 2014-08-29 LAB — POCT FERN TEST: POCT Fern Test: NEGATIVE

## 2014-08-29 NOTE — MAU Note (Signed)
Pt presents to MAU with complaints of clear leakage of fluid since yesterday. Reports that she was working in the home and it was hot in the home and couldn't determine if she was just sweaty. Denies any vaginal bleeding or cramping, some mild stabbing in her lower abdomen

## 2014-08-29 NOTE — MAU Provider Note (Signed)
Amy Brock is a 29 y.o. 5369w0d at 4469w0d. She had leaking yesterday- opaque at first, then clear, no odor. It has continued today. No bleeding.  SSE-  Ext gen- nl anatomy, skin intact Vagina- small amt creamy white discharge RN to read slide, do cervical exam

## 2014-08-29 NOTE — Discharge Instructions (Signed)

## 2014-09-08 ENCOUNTER — Inpatient Hospital Stay (HOSPITAL_COMMUNITY)
Admission: AD | Admit: 2014-09-08 | Discharge: 2014-09-08 | Disposition: A | Discharge status: Home / Self Care | Payer: BLUE CROSS/BLUE SHIELD | Source: Ambulatory Visit | Attending: Obstetrics and Gynecology | Admitting: Obstetrics and Gynecology

## 2014-09-08 ENCOUNTER — Encounter (HOSPITAL_COMMUNITY): Payer: Self-pay | Admitting: *Deleted

## 2014-09-08 DIAGNOSIS — R030 Elevated blood-pressure reading, without diagnosis of hypertension: Secondary | ICD-10-CM | POA: Diagnosis present

## 2014-09-08 DIAGNOSIS — Z3A39 39 weeks gestation of pregnancy: Secondary | ICD-10-CM | POA: Insufficient documentation

## 2014-09-08 DIAGNOSIS — O1203 Gestational edema, third trimester: Secondary | ICD-10-CM

## 2014-09-08 DIAGNOSIS — H538 Other visual disturbances: Secondary | ICD-10-CM

## 2014-09-08 DIAGNOSIS — O9989 Other specified diseases and conditions complicating pregnancy, childbirth and the puerperium: Secondary | ICD-10-CM | POA: Diagnosis not present

## 2014-09-08 LAB — URINALYSIS, ROUTINE W REFLEX MICROSCOPIC
BILIRUBIN URINE: NEGATIVE
GLUCOSE, UA: NEGATIVE mg/dL
Ketones, ur: NEGATIVE mg/dL
Nitrite: NEGATIVE
PROTEIN: NEGATIVE mg/dL
SPECIFIC GRAVITY, URINE: 1.015 (ref 1.005–1.030)
Urobilinogen, UA: 0.2 mg/dL (ref 0.0–1.0)
pH: 6 (ref 5.0–8.0)

## 2014-09-08 LAB — URIC ACID: URIC ACID, SERUM: 4.6 mg/dL (ref 2.4–7.0)

## 2014-09-08 LAB — PROTEIN / CREATININE RATIO, URINE
CREATININE, URINE: 60 mg/dL
Protein Creatinine Ratio: 0.17 — ABNORMAL HIGH (ref 0.00–0.15)
TOTAL PROTEIN, URINE: 10 mg/dL

## 2014-09-08 LAB — CBC
HCT: 35.2 % — ABNORMAL LOW (ref 36.0–46.0)
HEMOGLOBIN: 12.2 g/dL (ref 12.0–15.0)
MCH: 32.7 pg (ref 26.0–34.0)
MCHC: 34.7 g/dL (ref 30.0–36.0)
MCV: 94.4 fL (ref 78.0–100.0)
Platelets: 133 10*3/uL — ABNORMAL LOW (ref 150–400)
RBC: 3.73 MIL/uL — ABNORMAL LOW (ref 3.87–5.11)
RDW: 12.3 % (ref 11.5–15.5)
WBC: 11.6 10*3/uL — ABNORMAL HIGH (ref 4.0–10.5)

## 2014-09-08 LAB — COMPREHENSIVE METABOLIC PANEL
ALBUMIN: 2.9 g/dL — AB (ref 3.5–5.2)
ALT: 18 U/L (ref 0–35)
ANION GAP: 6 (ref 5–15)
AST: 22 U/L (ref 0–37)
Alkaline Phosphatase: 148 U/L — ABNORMAL HIGH (ref 39–117)
BUN: 9 mg/dL (ref 6–23)
CALCIUM: 8.6 mg/dL (ref 8.4–10.5)
CHLORIDE: 107 mmol/L (ref 96–112)
CO2: 21 mmol/L (ref 19–32)
Creatinine, Ser: 0.58 mg/dL (ref 0.50–1.10)
GFR calc Af Amer: >90 mL/min (ref >90)
GLUCOSE: 103 mg/dL — AB (ref 70–99)
POTASSIUM: 3.6 mmol/L (ref 3.5–5.1)
SODIUM: 134 mmol/L — AB (ref 135–145)
TOTAL PROTEIN: 6 g/dL (ref 6.0–8.3)
Total Bilirubin: 0.4 mg/dL (ref 0.3–1.2)

## 2014-09-08 LAB — URINE MICROSCOPIC-ADD ON

## 2014-09-08 NOTE — MAU Note (Signed)
Pt sent from MD office with new onset of elevated BP's today, slight HA with blurred vision, also dizzy.  Denies bleeding or LOF.

## 2014-09-08 NOTE — MAU Provider Note (Signed)
History     CSN: [id number]  Arrival date and time: 09/08/14 1317   None     Chief Complaint  Patient presents with  . Hypertension   HPI THis is a 29 y.o. female at 6464w3d who presents from office for evaluation of hypertension. She had been running low BPs and today was 135/88.  Has a mild headache and blurry vision. Has had edema for weeks. Good FM   OB History    Gravida Para Term Preterm AB TAB SAB Ectopic Multiple Living   1               Past Medical History  Diagnosis Date  . THYROIDITIS, HX OF 2010  . VSD     congenital, closed spont  . THYROID CYST 10/28/2008    Past Surgical History  Procedure Laterality Date  . Tonsillectomy      @ 11/ post op hemorrhage  . Anterior cruciate ligament repair  age 29    Family History  Problem Relation Age of Onset  . Breast cancer Maternal Aunt   . Hypertension Maternal Grandmother     MI in 8780's & CHF  . Hypertension Maternal Grandfather     ? MI @ 85  . Hypertension Paternal Grandfather     MI in 2080's & CHF    History  Substance Use Topics  . Smoking status: Never Smoker   . 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 Alcohol Use: Yes     Comment: occassional    Allergies: No Known Allergies  Prescriptions prior to admission  Medication Sig Dispense Refill Last Dose  . Prenatal Vit-Fe Fumarate-FA (PRENATAL MULTIVITAMIN) TABS tablet Take 1 tablet by mouth daily at 12 noon.   09/07/2014 at Unknown time    Review of Systems  Constitutional: Negative for fever, chills and malaise/fatigue.  Eyes: Positive for blurred vision.  Gastrointestinal: Negative for nausea, vomiting and abdominal pain.  Neurological: Positive for headaches (mild). Negative for dizziness and focal weakness.   Physical Exam   Last menstrual period 12/06/2013.  Physical Exam  Constitutional: She is oriented to person, place, and time. She appears well-developed and well-nourished.  HENT:  Head:  Normocephalic.  Cardiovascular: Normal rate, regular rhythm and normal heart sounds.  Exam reveals no gallop and no friction rub.   No murmur heard. Respiratory: Effort normal and breath sounds normal. No respiratory distress. She has no wheezes. She has no rales.  GI: Soft.  Musculoskeletal: Normal range of motion. She exhibits edema (Trace).  Neurological: She is alert and oriented to person, place, and time. She has normal reflexes. She displays normal reflexes. She exhibits normal muscle tone.  Skin: Skin is warm and dry.  Psychiatric: She has a normal mood and affect.   FHR reactive with good accels Irregular mild contractions  MAU Course  Procedures  MDM Filed Vitals:   09/08/14 1453 09/08/14 1503 09/08/14 1513 09/08/14 1630  BP: 133/81 128/74 131/67 128/77  Pulse: 61 57 73 65  Resp:    16   Results for Nelida MeuseBOYD, Jaleeya C (MRN [id number]) as of 09/13/2014 23:26  Ref. Range 09/08/2014 14:18  Sodium Latest Range: 135-145 mmol/L 134 (L)  Potassium Latest Range: 3.5-5.1 mmol/L 3.6  Chloride Latest Range: 96-112 mmol/L 107  CO2 Latest Range: 19-32 mmol/L 21  BUN Latest Range: 6-23 mg/dL 9  Creatinine Latest Range: 0.50-1.10 mg/dL 7.250.58  Calcium Latest Range: 8.4-10.5 mg/dL 8.6  GFR calc  non Af Amer Latest Range: >90 mL/min >90  GFR calc Af Amer Latest Range: >90 mL/min >90  Glucose Latest Range: 70-99 mg/dL 956 (H)  Anion gap Latest Range: 5-15  6  Alkaline Phosphatase Latest Range: 39-117 U/L 148 (H)  Albumin Latest Range: 3.5-5.2 g/dL 2.9 (L)  Uric Acid, Serum Latest Range: 2.4-7.0 mg/dL 4.6  AST Latest Range: 0-37 U/L 22  ALT Latest Range: 0-35 U/L 18  Total Protein Latest Range: 6.0-8.3 g/dL 6.0  Total Bilirubin Latest Range: 0.3-1.2 mg/dL 0.4  WBC Latest Range: 4.0-10.5 K/uL 11.6 (H)  RBC Latest Range: 3.87-5.11 MIL/uL 3.73 (L)  Hemoglobin Latest Range: 12.0-15.0 g/dL 21.3  HCT Latest Range: 36.0-46.0 % 35.2 (L)  MCV Latest Range: 78.0-100.0 fL 94.4  MCH Latest Range:  26.0-34.0 pg 32.7  MCHC Latest Range: 30.0-36.0 g/dL 08.6  RDW Latest Range: 11.5-15.5 % 12.3  Platelets Latest Range: 150-400 K/uL 133 (L)   Results for KAELEE, PFEFFER (MRN [id number]) as of 09/13/2014 23:26  Ref. Range 09/08/2014 13:50  Protein Latest Range: NEGATIVE mg/dL NEGATIVE   Results for VERSIE, SOAVE (MRN [id number]) as of 09/13/2014 23:26  Ref. Range 09/08/2014 13:50  Protein Creatinine Ratio Latest Range: 0.00-0.15  0.17 (H)   Assessment and Plan  A:  SIUP at [redacted]w[redacted]d      Labile blood pressures, no overt hypertension      Normal labs except for mild thrombocytopenia  P:  Discussed with Dr Henderson Cloud       Discharge home with [facility] precautions       Followup in office  [facility] 09/08/2014, 2:18 PM

## 2014-09-08 NOTE — Discharge Instructions (Signed)
Blurred Vision You have been seen today complaining of blurred vision. This means you have a loss of ability to see small details.  CAUSES  Blurred vision can be a symptom of underlying eye problems, such as:  Aging of the eye (presbyopia).  Glaucoma.  Cataracts.  Eye infection.  Eye-related migraine.  Diabetes mellitus.  Fatigue.  Migraine headaches.  High blood pressure.  Breakdown of the back of the eye (macular degeneration).  Problems caused by some medications. The most common cause of blurred vision is the need for eyeglasses or a new prescription. Today in the emergency department, no cause for your blurred vision can be found. SYMPTOMS  Blurred vision is the loss of visual sharpness and detail (acuity). DIAGNOSIS  Should blurred vision continue, you should see your caregiver. If your caregiver is your primary care physician, he or she may choose to refer you to another specialist.  TREATMENT  Do not ignore your blurred vision. Make sure to have it checked out to see if further treatment or referral is necessary. SEEK MEDICAL CARE IF:  You are unable to get into a specialist so we can help you with a referral. SEEK IMMEDIATE MEDICAL CARE IF: You have severe eye pain, severe headache, or sudden loss of vision. MAKE SURE YOU:   Understand these instructions.  Will watch your condition.  Will get help right away if you are not doing well or get worse. Document Released: 08/02/2003 Document Revised: 10/22/2011 Document Reviewed: 03/03/2008 [facility] Patient Information 2015 [employer] [place]. This information is not intended to replace advice given to you by your health care provider. Make sure you discuss any questions you have with your health care provider.  Third Trimester of Pregnancy The third trimester is from week 29 through week 42, months 7 through 9. The third trimester is a time when the fetus is growing rapidly. At the end of the ninth month, the fetus is  about 20 inches in length and weighs 6-10 pounds.  BODY CHANGES Your body goes through many changes during pregnancy. The changes vary from woman to woman.   Your weight will continue to increase. You can expect to gain 25-35 pounds (11-16 kg) by the end of the pregnancy.  You may begin to get stretch marks on your hips, abdomen, and breasts.  You may urinate more often because the fetus is moving lower into your pelvis and pressing on your bladder.  You may develop or continue to have heartburn as a result of your pregnancy.  You may develop constipation because certain hormones are causing the muscles that push waste through your intestines to slow down.  You may develop hemorrhoids or swollen, bulging veins (varicose veins).  You may have pelvic pain because of the weight gain and pregnancy hormones relaxing your joints between the bones in your pelvis. Backaches may result from overexertion of the muscles supporting your posture.  You may have changes in your hair. These can include thickening of your hair, rapid growth, and changes in texture. Some women also have hair loss during or after pregnancy, or hair that feels dry or thin. Your hair will most likely return to normal after your baby is born.  Your breasts will continue to grow and be tender. A yellow discharge may leak from your breasts called colostrum.  Your belly button may stick out.  You may feel short of breath because of your expanding uterus.  You may notice the fetus "dropping," or moving lower in your abdomen.  You may have a bloody mucus discharge. This usually occurs a few days to a week before labor begins.  Your cervix becomes thin and soft (effaced) near your due date. WHAT TO EXPECT AT YOUR PRENATAL EXAMS  You will have prenatal exams every 2 weeks until week 36. Then, you will have weekly prenatal exams. During a routine prenatal visit:  You will be weighed to make sure you and the fetus are growing  normally.  Your blood pressure is taken.  Your abdomen will be measured to track your baby's growth.  The fetal heartbeat will be listened to.  Any test results from the previous visit will be discussed.  You may have a cervical check near your due date to see if you have effaced. At around 36 weeks, your caregiver will check your cervix. At the same time, your caregiver will also perform a test on the secretions of the vaginal tissue. This test is to determine if a type of bacteria, Group B streptococcus, is present. Your caregiver will explain this further. Your caregiver may ask you:  What your birth plan is.  How you are feeling.  If you are feeling the baby move.  If you have had any abnormal symptoms, such as leaking fluid, bleeding, severe headaches, or abdominal cramping.  If you have any questions. Other tests or screenings that may be performed during your third trimester include:  Blood tests that check for low iron levels (anemia).  Fetal testing to check the health, activity level, and growth of the fetus. Testing is done if you have certain medical conditions or if there are problems during the pregnancy. FALSE LABOR You may feel small, irregular contractions that eventually go away. These are called Braxton Hicks contractions, or false labor. Contractions may last for hours, days, or even weeks before true labor sets in. If contractions come at regular intervals, intensify, or become painful, it is best to be seen by your caregiver.  SIGNS OF LABOR   Menstrual-like cramps.  Contractions that are 5 minutes apart or less.  Contractions that start on the top of the uterus and spread down to the lower abdomen and back.  A sense of increased pelvic pressure or back pain.  A watery or bloody mucus discharge that comes from the vagina. If you have any of these signs before the 37th week of pregnancy, call your caregiver right away. You need to go to the hospital to  get checked immediately. HOME CARE INSTRUCTIONS   Avoid all smoking, herbs, alcohol, and unprescribed drugs. These chemicals affect the formation and growth of the baby.  Follow your caregiver's instructions regarding medicine use. There are medicines that are either safe or unsafe to take during pregnancy.  Exercise only as directed by your caregiver. Experiencing uterine cramps is a good sign to stop exercising.  Continue to eat regular, healthy meals.  Wear a good support bra for breast tenderness.  Do not use hot tubs, steam rooms, or saunas.  Wear your seat belt at all times when driving.  Avoid raw meat, uncooked cheese, cat litter boxes, and soil used by cats. These carry germs that can cause birth defects in the baby.  Take your prenatal vitamins.  Try taking a stool softener (if your caregiver approves) if you develop constipation. Eat more high-fiber foods, such as fresh vegetables or fruit and whole grains. Drink plenty of fluids to keep your urine clear or pale yellow.  Take warm sitz baths to soothe any pain  or discomfort caused by hemorrhoids. Use hemorrhoid cream if your caregiver approves.  If you develop varicose veins, wear support hose. Elevate your feet for 15 minutes, 3-4 times a day. Limit salt in your diet.  Avoid heavy lifting, wear low heal shoes, and practice good posture.  Rest a lot with your legs elevated if you have leg cramps or low back pain.  Visit your dentist if you have not gone during your pregnancy. Use a soft toothbrush to brush your teeth and be gentle when you floss.  A sexual relationship may be continued unless your caregiver directs you otherwise.  Do not travel far distances unless it is absolutely necessary and only with the approval of your caregiver.  Take prenatal classes to understand, practice, and ask questions about the labor and delivery.  Make a trial run to the hospital.  Pack your hospital bag.  Prepare the baby's  nursery.  Continue to go to all your prenatal visits as directed by your caregiver. SEEK MEDICAL CARE IF:  You are unsure if you are in labor or if your water has broken.  You have dizziness.  You have mild pelvic cramps, pelvic pressure, or nagging pain in your abdominal area.  You have persistent nausea, vomiting, or diarrhea.  You have a bad smelling vaginal discharge.  You have pain with urination. SEEK IMMEDIATE MEDICAL CARE IF:   You have a fever.  You are leaking fluid from your vagina.  You have spotting or bleeding from your vagina.  You have severe abdominal cramping or pain.  You have rapid weight loss or gain.  You have shortness of breath with chest pain.  You notice sudden or extreme swelling of your face, hands, ankles, feet, or legs.  You have not felt your baby move in over an hour.  You have severe headaches that do not go away with medicine.  You have vision changes. Document Released: 07/24/2001 Document Revised: 08/04/2013 Document Reviewed: 09/30/2012 [facility] Patient Information 2015 [place], [place]. This information is not intended to replace advice given to you by your health care provider. Make sure you discuss any questions you have with your health care provider.

## 2014-09-14 ENCOUNTER — Inpatient Hospital Stay (HOSPITAL_COMMUNITY): Admission: RE | Admit: 2014-09-14 | Payer: BLUE CROSS/BLUE SHIELD | Source: Ambulatory Visit

## 2014-09-17 ENCOUNTER — Encounter (HOSPITAL_COMMUNITY): Payer: Self-pay | Admitting: *Deleted

## 2014-09-17 ENCOUNTER — Telehealth (HOSPITAL_COMMUNITY): Payer: Self-pay | Admitting: *Deleted

## 2014-09-17 NOTE — Telephone Encounter (Signed)
Preadmission screen  

## 2014-09-19 ENCOUNTER — Encounter (HOSPITAL_COMMUNITY): Payer: Self-pay

## 2014-09-19 ENCOUNTER — Inpatient Hospital Stay (HOSPITAL_COMMUNITY)
Admission: RE | Admit: 2014-09-19 | Discharge: 2014-09-22 | DRG: 775 | Disposition: A | Discharge status: Home / Self Care | Payer: BLUE CROSS/BLUE SHIELD | Source: Ambulatory Visit | Attending: Obstetrics and Gynecology | Admitting: Obstetrics and Gynecology

## 2014-09-19 DIAGNOSIS — Z3490 Encounter for supervision of normal pregnancy, unspecified, unspecified trimester: Secondary | ICD-10-CM

## 2014-09-19 DIAGNOSIS — O133 Gestational [pregnancy-induced] hypertension without significant proteinuria, third trimester: Principal | ICD-10-CM | POA: Diagnosis present

## 2014-09-19 DIAGNOSIS — O99824 Streptococcus B carrier state complicating childbirth: Secondary | ICD-10-CM | POA: Diagnosis present

## 2014-09-19 DIAGNOSIS — O480 Post-term pregnancy: Secondary | ICD-10-CM | POA: Diagnosis present

## 2014-09-19 DIAGNOSIS — Z3A41 41 weeks gestation of pregnancy: Secondary | ICD-10-CM | POA: Diagnosis present

## 2014-09-19 DIAGNOSIS — Z8249 Family history of ischemic heart disease and other diseases of the circulatory system: Secondary | ICD-10-CM

## 2014-09-19 LAB — COMPREHENSIVE METABOLIC PANEL
ALBUMIN: 2.9 g/dL — AB (ref 3.5–5.2)
ALT: 17 U/L (ref 0–35)
AST: 25 U/L (ref 0–37)
Alkaline Phosphatase: 158 U/L — ABNORMAL HIGH (ref 39–117)
Anion gap: 4 — ABNORMAL LOW (ref 5–15)
BILIRUBIN TOTAL: 0.3 mg/dL (ref 0.3–1.2)
BUN: 12 mg/dL (ref 6–23)
CHLORIDE: 108 mmol/L (ref 96–112)
CO2: 22 mmol/L (ref 19–32)
CREATININE: 0.53 mg/dL (ref 0.50–1.10)
Calcium: 8.8 mg/dL (ref 8.4–10.5)
GFR calc Af Amer: >90 mL/min (ref >90)
GFR calc non Af Amer: >90 mL/min (ref >90)
Glucose, Bld: 94 mg/dL (ref 70–99)
Potassium: 3.8 mmol/L (ref 3.5–5.1)
SODIUM: 134 mmol/L — AB (ref 135–145)
Total Protein: 5.3 g/dL — ABNORMAL LOW (ref 6.0–8.3)

## 2014-09-19 LAB — CBC
HEMATOCRIT: 34.7 % — AB (ref 36.0–46.0)
HEMOGLOBIN: 12.1 g/dL (ref 12.0–15.0)
MCH: 33 pg (ref 26.0–34.0)
MCHC: 34.9 g/dL (ref 30.0–36.0)
MCV: 94.6 fL (ref 78.0–100.0)
Platelets: 117 10*3/uL — ABNORMAL LOW (ref 150–400)
RBC: 3.67 MIL/uL — ABNORMAL LOW (ref 3.87–5.11)
RDW: 12.5 % (ref 11.5–15.5)
WBC: 11.6 10*3/uL — ABNORMAL HIGH (ref 4.0–10.5)

## 2014-09-19 MED ORDER — BUTORPHANOL TARTRATE 1 MG/ML IJ SOLN
1.0000 mg | INTRAMUSCULAR | Status: DC | PRN
  Filled 2014-09-19: qty 1

## 2014-09-19 MED ORDER — TERBUTALINE SULFATE 1 MG/ML IJ SOLN: 0.2500 mg | Freq: Once | INTRAMUSCULAR | Status: AC | PRN

## 2014-09-19 MED ORDER — PENICILLIN G POTASSIUM 5000000 UNITS IJ SOLR
2.5000 10*6.[IU] | INTRAVENOUS | Status: DC
  Filled 2014-09-19 (×2): qty 2.5

## 2014-09-19 MED ORDER — ZOLPIDEM TARTRATE 5 MG PO TABS: 5.0000 mg | ORAL_TABLET | Freq: Every evening | ORAL | Status: DC | PRN

## 2014-09-19 MED ORDER — LACTATED RINGERS IV SOLN
INTRAVENOUS | Status: DC
  Administered 2014-09-19 – 2014-09-20 (×2): via INTRAVENOUS

## 2014-09-19 MED ORDER — OXYTOCIN 40 UNITS IN LACTATED RINGERS INFUSION - SIMPLE MED
62.5000 mL/h | INTRAVENOUS | Status: DC
  Administered 2014-09-20: 62.5 mL/h via INTRAVENOUS
  Filled 2014-09-19: qty 1000

## 2014-09-19 MED ORDER — OXYCODONE-ACETAMINOPHEN 5-325 MG PO TABS: 2.0000 | ORAL_TABLET | ORAL | Status: DC | PRN

## 2014-09-19 MED ORDER — OXYTOCIN BOLUS FROM INFUSION
500.0000 mL | INTRAVENOUS | Status: DC
Start: 2014-09-19 — End: 2014-09-20
  Administered 2014-09-20: 500 mL via INTRAVENOUS

## 2014-09-19 MED ORDER — MISOPROSTOL 25 MCG QUARTER TABLET
25.0000 ug | ORAL_TABLET | ORAL | Status: DC | PRN
  Administered 2014-09-19: 25 ug via VAGINAL
  Filled 2014-09-19: qty 0.25
  Filled 2014-09-19: qty 1

## 2014-09-19 MED ORDER — LACTATED RINGERS IV SOLN: 500.0000 mL | INTRAVENOUS | Status: DC | PRN

## 2014-09-19 MED ORDER — ONDANSETRON HCL 4 MG/2ML IJ SOLN
4.0000 mg | Freq: Four times a day (QID) | INTRAMUSCULAR | Status: DC | PRN
  Administered 2014-09-20: 4 mg via INTRAVENOUS
  Filled 2014-09-19: qty 2

## 2014-09-19 MED ORDER — PENICILLIN G POTASSIUM 5000000 UNITS IJ SOLR
5.0000 10*6.[IU] | Freq: Once | INTRAVENOUS | Status: DC
  Filled 2014-09-19: qty 5

## 2014-09-19 MED ORDER — ACETAMINOPHEN 325 MG PO TABS: 650.0000 mg | ORAL_TABLET | ORAL | Status: DC | PRN

## 2014-09-19 MED ORDER — CITRIC ACID-SODIUM CITRATE 334-500 MG/5ML PO SOLN: 30.0000 mL | ORAL | Status: DC | PRN

## 2014-09-19 MED ORDER — OXYCODONE-ACETAMINOPHEN 5-325 MG PO TABS: 1.0000 | ORAL_TABLET | ORAL | Status: DC | PRN

## 2014-09-19 MED ORDER — LIDOCAINE HCL (PF) 1 % IJ SOLN
30.0000 mL | INTRAMUSCULAR | Status: DC | PRN
  Administered 2014-09-20: 30 mL via SUBCUTANEOUS
  Filled 2014-09-19: qty 30

## 2014-09-19 MED ORDER — FLEET ENEMA 7-19 GM/118ML RE ENEM: 1.0000 | ENEMA | RECTAL | Status: DC | PRN

## 2014-09-20 ENCOUNTER — Encounter (HOSPITAL_COMMUNITY): Payer: Self-pay

## 2014-09-20 ENCOUNTER — Inpatient Hospital Stay (HOSPITAL_COMMUNITY): Payer: BLUE CROSS/BLUE SHIELD | Admitting: Anesthesiology

## 2014-09-20 LAB — CBC
HEMATOCRIT: 36.8 % (ref 36.0–46.0)
Hemoglobin: 12.8 g/dL (ref 12.0–15.0)
MCH: 33 pg (ref 26.0–34.0)
MCHC: 34.8 g/dL (ref 30.0–36.0)
MCV: 94.8 fL (ref 78.0–100.0)
PLATELETS: 123 10*3/uL — AB (ref 150–400)
RBC: 3.88 MIL/uL (ref 3.87–5.11)
RDW: 12.7 % (ref 11.5–15.5)
WBC: 27.1 10*3/uL — AB (ref 4.0–10.5)

## 2014-09-20 MED ORDER — IBUPROFEN 600 MG PO TABS
600.0000 mg | ORAL_TABLET | Freq: Four times a day (QID) | ORAL | Status: DC
Start: 2014-09-20 — End: 2014-09-22
  Administered 2014-09-21 – 2014-09-22 (×7): 600 mg via ORAL
  Filled 2014-09-20 (×6): qty 1

## 2014-09-20 MED ORDER — OXYCODONE-ACETAMINOPHEN 5-325 MG PO TABS
2.0000 | ORAL_TABLET | ORAL | Status: DC | PRN
Start: 2014-09-20 — End: 2014-09-22

## 2014-09-20 MED ORDER — LIDOCAINE-EPINEPHRINE (PF) 2 %-1:200000 IJ SOLN
INTRAMUSCULAR | Status: DC | PRN
  Administered 2014-09-20: 3 mL

## 2014-09-20 MED ORDER — ONDANSETRON HCL 4 MG/2ML IJ SOLN: 4.0000 mg | INTRAMUSCULAR | Status: DC | PRN

## 2014-09-20 MED ORDER — FENTANYL 2.5 MCG/ML BUPIVACAINE 1/10 % EPIDURAL INFUSION (WH - ANES)
INTRAMUSCULAR | Status: DC | PRN
  Administered 2014-09-20: 14 mL/h via EPIDURAL

## 2014-09-20 MED ORDER — PHENYLEPHRINE 40 MCG/ML (10ML) SYRINGE FOR IV PUSH (FOR BLOOD PRESSURE SUPPORT)
PREFILLED_SYRINGE | INTRAVENOUS | Status: AC
  Filled 2014-09-20: qty 20

## 2014-09-20 MED ORDER — FENTANYL 2.5 MCG/ML BUPIVACAINE 1/10 % EPIDURAL INFUSION (WH - ANES)
INTRAMUSCULAR | Status: AC
  Administered 2014-09-20: 14 mL/h via EPIDURAL
  Filled 2014-09-20: qty 125

## 2014-09-20 MED ORDER — PENICILLIN G POTASSIUM 5000000 UNITS IJ SOLR
2.5000 10*6.[IU] | INTRAVENOUS | Status: DC
  Administered 2014-09-20 (×2): 2.5 10*6.[IU] via INTRAVENOUS
  Filled 2014-09-20 (×5): qty 2.5

## 2014-09-20 MED ORDER — SIMETHICONE 80 MG PO CHEW: 80.0000 mg | CHEWABLE_TABLET | ORAL | Status: DC | PRN

## 2014-09-20 MED ORDER — WITCH HAZEL-GLYCERIN EX PADS: 1.0000 "application " | MEDICATED_PAD | CUTANEOUS | Status: DC | PRN

## 2014-09-20 MED ORDER — DIPHENHYDRAMINE HCL 50 MG/ML IJ SOLN: 12.5000 mg | INTRAMUSCULAR | Status: DC | PRN

## 2014-09-20 MED ORDER — PRENATAL MULTIVITAMIN CH: 1.0000 | ORAL_TABLET | Freq: Every day | ORAL | Status: DC

## 2014-09-20 MED ORDER — LIDOCAINE-EPINEPHRINE (PF) 2 %-1:200000 IJ SOLN
INTRAMUSCULAR | Status: DC | PRN
  Administered 2014-09-20 (×2): 5 mL via EPIDURAL

## 2014-09-20 MED ORDER — DEXTROSE 5 % IV SOLN
5.0000 10*6.[IU] | Freq: Once | INTRAVENOUS | Status: AC
Start: 2014-09-20 — End: 2014-09-20
  Administered 2014-09-20: 5 10*6.[IU] via INTRAVENOUS
  Filled 2014-09-20: qty 5

## 2014-09-20 MED ORDER — FENTANYL 2.5 MCG/ML BUPIVACAINE 1/10 % EPIDURAL INFUSION (WH - ANES)
14.0000 mL/h | INTRAMUSCULAR | Status: DC | PRN
  Administered 2014-09-20 (×3): 14 mL/h via EPIDURAL
  Filled 2014-09-20 (×3): qty 125

## 2014-09-20 MED ORDER — TETANUS-DIPHTH-ACELL PERTUSSIS 5-2.5-18.5 LF-MCG/0.5 IM SUSP: 0.5000 mL | Freq: Once | INTRAMUSCULAR | Status: DC

## 2014-09-20 MED ORDER — BUPIVACAINE HCL (PF) 0.25 % IJ SOLN
INTRAMUSCULAR | Status: DC | PRN
  Administered 2014-09-20 (×2): 4 mL via EPIDURAL

## 2014-09-20 MED ORDER — MEASLES, MUMPS & RUBELLA VAC ~~LOC~~ INJ: 0.5000 mL | INJECTION | Freq: Once | SUBCUTANEOUS | Status: DC

## 2014-09-20 MED ORDER — SENNOSIDES-DOCUSATE SODIUM 8.6-50 MG PO TABS
2.0000 | ORAL_TABLET | ORAL | Status: DC
  Administered 2014-09-21 (×2): 2 via ORAL
  Filled 2014-09-20 (×2): qty 2

## 2014-09-20 MED ORDER — PRENATAL MULTIVITAMIN CH
1.0000 | ORAL_TABLET | Freq: Every day | ORAL | Status: DC
  Administered 2014-09-21 – 2014-09-22 (×2): 1 via ORAL
  Filled 2014-09-20: qty 1

## 2014-09-20 MED ORDER — ONDANSETRON HCL 4 MG PO TABS: 4.0000 mg | ORAL_TABLET | ORAL | Status: DC | PRN

## 2014-09-20 MED ORDER — MEDROXYPROGESTERONE ACETATE 150 MG/ML IM SUSP: 150.0000 mg | INTRAMUSCULAR | Status: DC | PRN

## 2014-09-20 MED ORDER — OXYCODONE-ACETAMINOPHEN 5-325 MG PO TABS
1.0000 | ORAL_TABLET | ORAL | Status: DC | PRN
  Administered 2014-09-20 – 2014-09-22 (×6): 1 via ORAL
  Filled 2014-09-20 (×6): qty 1

## 2014-09-20 MED ORDER — BENZOCAINE-MENTHOL 20-0.5 % EX AERO
1.0000 "application " | INHALATION_SPRAY | CUTANEOUS | Status: DC | PRN
  Administered 2014-09-20: 1 via TOPICAL
  Filled 2014-09-20: qty 56

## 2014-09-20 MED ORDER — DIPHENHYDRAMINE HCL 25 MG PO CAPS: 25.0000 mg | ORAL_CAPSULE | Freq: Four times a day (QID) | ORAL | Status: DC | PRN

## 2014-09-20 MED ORDER — LANOLIN HYDROUS EX OINT: TOPICAL_OINTMENT | CUTANEOUS | Status: DC | PRN

## 2014-09-20 MED ORDER — PHENYLEPHRINE 40 MCG/ML (10ML) SYRINGE FOR IV PUSH (FOR BLOOD PRESSURE SUPPORT)
80.0000 ug | PREFILLED_SYRINGE | INTRAVENOUS | Status: DC | PRN
  Filled 2014-09-20: qty 2
  Filled 2014-09-20: qty 20

## 2014-09-20 MED ORDER — EPHEDRINE 5 MG/ML INJ
10.0000 mg | INTRAVENOUS | Status: DC | PRN
  Filled 2014-09-20: qty 2

## 2014-09-20 MED ORDER — PHENYLEPHRINE 40 MCG/ML (10ML) SYRINGE FOR IV PUSH (FOR BLOOD PRESSURE SUPPORT)
80.0000 ug | PREFILLED_SYRINGE | INTRAVENOUS | Status: DC | PRN
  Filled 2014-09-20: qty 20
  Filled 2014-09-20: qty 2

## 2014-09-20 MED ORDER — DIBUCAINE 1 % RE OINT: 1.0000 "application " | TOPICAL_OINTMENT | RECTAL | Status: DC | PRN

## 2014-09-20 MED ORDER — LACTATED RINGERS IV SOLN: 500.0000 mL | Freq: Once | INTRAVENOUS | Status: DC

## 2014-09-20 NOTE — Anesthesia Preprocedure Evaluation (Signed)
Anesthesia Evaluation  Patient identified by MRN, date of birth, ID band Patient awake    Reviewed: Allergy & Precautions, NPO status , Patient's Chart, lab work & pertinent test results  History of Anesthesia Complications Negative for: history of anesthetic complications  Airway Mallampati: II  TM Distance: >3 FB Neck ROM: Full    Dental  (+) Teeth Intact   Pulmonary neg pulmonary ROS,  breath sounds clear to auscultation        Cardiovascular hypertension, Pt. on medications Rhythm:Regular     Neuro/Psych negative neurological ROS     GI/Hepatic negative GI ROS, Neg liver ROS,   Endo/Other  negative endocrine ROS  Renal/GU negative Renal ROS     Musculoskeletal   Abdominal   Peds  Hematology  (+) anemia ,   Anesthesia Other Findings   Reproductive/Obstetrics (+) Pregnancy                             Anesthesia Physical Anesthesia Plan  ASA: II  Anesthesia Plan: Epidural   Post-op Pain Management:    Induction:   Airway Management Planned:   Additional Equipment:   Intra-op Plan:   Post-operative Plan:   Informed Consent: I have reviewed the patients History and Physical, chart, labs and discussed the procedure including the risks, benefits and alternatives for the proposed anesthesia with the patient or authorized representative who has indicated his/her understanding and acceptance.     Plan Discussed with: Anesthesiologist  Anesthesia Plan Comments:         Anesthesia Quick Evaluation

## 2014-09-20 NOTE — Progress Notes (Signed)
SVD of vigorous female infant w/ apgars of 8,9.  Placenta delivered spontaneous w/ 3VC.   2nd degree lac repaired w/ 3-0 vicryl rapide.  Fundus firm.  EBL 450cc .

## 2014-09-20 NOTE — Anesthesia Procedure Notes (Signed)
Epidural Patient location during procedure: OB  Staffing Anesthesiologist: Townes Fuhs, CHRIS Performed by: anesthesiologist   Preanesthetic Checklist Completed: patient identified, surgical consent, pre-op evaluation, timeout performed, IV checked, risks and benefits discussed and monitors and equipment checked  Epidural Patient position: sitting Prep: site prepped and draped and DuraPrep Patient monitoring: heart rate, cardiac monitor, continuous pulse ox and blood pressure Approach: midline Location: L4-L5 Injection technique: LOR saline  Needle:  Needle type: Tuohy  Needle gauge: 17 G Needle length: 9 cm Needle insertion depth: 6 cm Catheter type: closed end flexible Catheter size: 19 Gauge Catheter at skin depth: 12 cm Test dose: 2% lidocaine with Epi 1:200 K and negative  Assessment Events: blood not aspirated, injection not painful, no injection resistance, negative IV test and no paresthesia  Additional Notes H+P and labs checked, risks and benefits discussed with the patient, consent obtained, procedure tolerated well and without complications.  Reason for block:procedure for pain

## 2014-09-20 NOTE — H&P (Signed)
Amy Brock is a 29 y.o. female presenting for IOL.  [facility].  No vb, ctx, or lof.  History OB History    Gravida Para Term Preterm AB TAB SAB Ectopic Multiple Living   1              Past Medical History  Diagnosis Date  . THYROIDITIS, HX OF 2010  . VSD     congenital, closed spont  . THYROID CYST 10/28/2008  . Vaginal Pap smear, abnormal   . Pregnancy induced hypertension    Past Surgical History  Procedure Laterality Date  . Tonsillectomy      @ 11/ post op hemorrhage  . Anterior cruciate ligament repair  age 29   Family History: family history includes Breast cancer in her maternal aunt; Cancer in her maternal aunt; Heart disease in her maternal grandfather, maternal grandmother, paternal grandfather, and paternal grandmother; Hypertension in her maternal grandfather, maternal grandmother, and paternal grandfather. Social History:  reports that she has never smoked. She has never used smokeless tobacco. She reports that she drinks alcohol. She reports that she does not use illicit drugs.   Prenatal Transfer Tool  Maternal Diabetes: No Genetic Screening: Declined Maternal Ultrasounds/Referrals: Normal Fetal Ultrasounds or other Referrals:  None Maternal Substance Abuse:  No Significant Maternal Medications:  None Significant Maternal Lab Results:  None Other Comments:  None  ROS  Dilation: 7 Effacement (%): 100 Station: -1 Exam by:: B.Cagna,RN Blood pressure 145/84, pulse 72, temperature 98.8 F (37.1 C), temperature source Oral, resp. rate 20, height 5\' 6"  (1.676 m), weight 92.08 kg (203 lb), last menstrual period 12/06/2013, SpO2 96 %. Exam Physical Exam   Prenatal labs: ABO, Rh: --/--/O NEG (02/07 2020) Antibody: POS (02/07 2020) Rubella: Immune (06/30 0000) RPR: Nonreactive (06/30 0000)  HBsAg: Negative (06/30 0000)  HIV: Non-reactive (06/30 0000)  GBS: Positive (01/04 0000)    Assessment/Plan: IOL/GHTN/postdates Labetalol prn Epidural Exp mngt   Shylo Zamor 09/20/2014, 8:20 AM

## 2014-09-21 ENCOUNTER — Encounter (HOSPITAL_COMMUNITY): Payer: Self-pay

## 2014-09-21 LAB — CBC
HCT: 31.2 % — ABNORMAL LOW (ref 36.0–46.0)
HCT: 29.1 % — ABNORMAL LOW (ref 36.0–46.0)
HEMOGLOBIN: 11 g/dL — AB (ref 12.0–15.0)
HEMOGLOBIN: 10.2 g/dL — AB (ref 12.0–15.0)
MCH: 33.3 pg (ref 26.0–34.0)
MCH: 33.1 pg (ref 26.0–34.0)
MCHC: 35.3 g/dL (ref 30.0–36.0)
MCHC: 35.1 g/dL (ref 30.0–36.0)
MCV: 94.5 fL (ref 78.0–100.0)
MCV: 94.5 fL (ref 78.0–100.0)
Platelets: 123 10*3/uL — ABNORMAL LOW (ref 150–400)
Platelets: 115 10*3/uL — ABNORMAL LOW (ref 150–400)
RBC: 3.3 MIL/uL — ABNORMAL LOW (ref 3.87–5.11)
RBC: 3.08 MIL/uL — ABNORMAL LOW (ref 3.87–5.11)
RDW: 12.7 % (ref 11.5–15.5)
RDW: 12.7 % (ref 11.5–15.5)
WBC: 23.4 10*3/uL — ABNORMAL HIGH (ref 4.0–10.5)
WBC: 17 10*3/uL — ABNORMAL HIGH (ref 4.0–10.5)

## 2014-09-21 LAB — COMPREHENSIVE METABOLIC PANEL
ALK PHOS: 113 U/L (ref 39–117)
ALT: 19 U/L (ref 0–35)
AST: 38 U/L — AB (ref 0–37)
Albumin: 2.6 g/dL — ABNORMAL LOW (ref 3.5–5.2)
Anion gap: 4 — ABNORMAL LOW (ref 5–15)
BILIRUBIN TOTAL: 0.5 mg/dL (ref 0.3–1.2)
BUN: 25 mg/dL — ABNORMAL HIGH (ref 6–23)
CO2: 22 mmol/L (ref 19–32)
Calcium: 8.2 mg/dL — ABNORMAL LOW (ref 8.4–10.5)
Chloride: 107 mmol/L (ref 96–112)
Creatinine, Ser: 1.09 mg/dL (ref 0.50–1.10)
GFR, EST AFRICAN AMERICAN: 79 mL/min — AB (ref >90)
GFR, EST NON AFRICAN AMERICAN: 68 mL/min — AB (ref >90)
Glucose, Bld: 73 mg/dL (ref 70–99)
Potassium: 3.8 mmol/L (ref 3.5–5.1)
Sodium: 133 mmol/L — ABNORMAL LOW (ref 135–145)
Total Protein: 5.1 g/dL — ABNORMAL LOW (ref 6.0–8.3)

## 2014-09-21 LAB — RPR: RPR: NONREACTIVE

## 2014-09-21 MED ORDER — FUROSEMIDE 10 MG/ML IJ SOLN
20.0000 mg | Freq: Once | INTRAMUSCULAR | Status: AC
  Administered 2014-09-21: 20 mg via INTRAVENOUS
  Filled 2014-09-21: qty 2

## 2014-09-21 MED ORDER — RHO D IMMUNE GLOBULIN 1500 UNIT/2ML IJ SOSY
300.0000 ug | PREFILLED_SYRINGE | Freq: Once | INTRAMUSCULAR | Status: AC
  Administered 2014-09-21: 300 ug via INTRAVENOUS
  Filled 2014-09-21: qty 2

## 2014-09-21 NOTE — Lactation Note (Signed)
This note was copied from the chart of Amy Brock. Lactation Consultation Note Initial visit  25 hours of age.  Mom holding baby STS and reports last feeding of 50 minutes, but before that feeding was 12 hours earlier.  Baby has been sleepy, but early after delivery had several feedings with good output.  Assisted with pillow support and positioning for football hold on right breast.  Mom has small bruise on right breast, encouraged to EBM and rub into nipples.  Baby opens mouth wide and mom needs to be assertive with latching.  Mom is able to express small drops of colostrum.  Baby maintained good deep latch for about 5 minutes and then stopped.  Mom still has foley catheter due to delivery trauma and swelling in legs due to high blood pressure.  Mom's dr. At bedside to order one dose of a diuretic.  Encouraged mom to begin post pumping after feedings with DEBP if baby is not active and regular with feedings tonight, baby is just now at the 24 hour mark.  This will help insure mom's milk supply.  [facility]  [facility] given and discussed.  Encouraged to feed with early cues on demand.  Early newborn behavior discussed.  Mom to call for assist as needed.   Patient Name: Amy Brock [id number]'s Date: 09/21/2014 Reason for consult: Initial assessment   Maternal Data Has patient been taught Hand Expression?: Yes Does the patient have breastfeeding experience prior to this delivery?: No  Feeding Feeding Type: Breast Fed Length of feed: 5 min  LATCH Score/Interventions Latch: Grasps breast easily, tongue down, lips flanged, rhythmical sucking.  Audible Swallowing: A few with stimulation Intervention(s): Skin to skin;Hand expression  Type of Nipple: Everted at rest and after stimulation  Comfort (Breast/Nipple): Soft / non-tender     Hold (Positioning): Assistance needed to correctly position infant at breast and maintain latch. Intervention(s): Breastfeeding basics  reviewed;Support Pillows;Position options;Skin to skin  LATCH Score: 8  Lactation Tools Discussed/Used     Consult Status Consult Status: Follow-up Date: 09/22/14 Follow-up type: In-patient    Jannifer RodneyShoptaw, Anothony Bursch Lynn 09/21/2014, 6:05 PM

## 2014-09-21 NOTE — Progress Notes (Signed)
Pt I&O cath in L&D d/t inability to void. 6 hours later, pt still not voided. Bladder scan at 2348 was 867 mls and I&O cath resulted in 750 mls.  Pt still not voided by [id number] and bladder scan was 769 mls.  MD on call, Dr. Renaldo FiddlerAdkins, notified and RN instructed to place indwelling urinary catheter which would be attempted to be removed around lunch time.

## 2014-09-21 NOTE — Anesthesia Postprocedure Evaluation (Signed)
  Anesthesia Post-op Note  Patient: Amy Brock  Procedure(s) Performed: * No procedures listed *  Patient Location: Mother/Baby  Anesthesia Type:Epidural  Level of Consciousness: awake, alert , oriented and patient cooperative  Airway and Oxygen Therapy: Patient Spontanous Breathing  Post-op Pain: none  Post-op Assessment: Post-op Vital signs reviewed, Patient's Cardiovascular Status Stable, Respiratory Function Stable, Patent Airway, No headache, No backache, No residual numbness and No residual motor weakness  Post-op Vital Signs: Reviewed and stable  Last Vitals:  Filed Vitals:   09/21/14 0556  BP: 126/67  Pulse: 61  Temp: 36.6 C  Resp: 20    Complications: No apparent anesthesia complications

## 2014-09-21 NOTE — Progress Notes (Signed)
Post Partum Day 1 Subjective: up ad lib, tolerating PO and unable to void, foley inserted. denies HA, blurred vision or RUQ pain  Objective: Blood pressure 126/67, pulse 61, temperature 97.8 F (36.6 C), temperature source Oral, resp. rate 20, height 5\' 6"  (1.676 m), weight 203 lb (92.08 kg), last menstrual period 12/06/2013, SpO2 97 %, unknown if currently breastfeeding.  Physical Exam:  General: alert and cooperative Lochia: appropriate Uterine Fundus: firm Incision: healing well, no significant edema noted DVT Evaluation: No evidence of DVT seen on physical exam. Negative Homan's sign. No cords or calf tenderness. Calf/Ankle edema is present. DTR's 2+   Recent Labs  09/20/14 1725 09/21/14 0540  HGB 12.8 11.0*  HCT 36.8 31.2*    Assessment/Plan: Plan for discharge tomorrow   check cmp at 12noon, watch bp, may need to restart Labetalol.   LOS: 2 days   CURTIS,CAROL G 09/21/2014, 8:08 AM

## 2014-09-22 LAB — CBC
HCT: 28.1 % — ABNORMAL LOW (ref 36.0–46.0)
Hemoglobin: 9.8 g/dL — ABNORMAL LOW (ref 12.0–15.0)
MCH: 33.6 pg (ref 26.0–34.0)
MCHC: 34.9 g/dL (ref 30.0–36.0)
MCV: 96.2 fL (ref 78.0–100.0)
PLATELETS: 108 10*3/uL — AB (ref 150–400)
RBC: 2.92 MIL/uL — ABNORMAL LOW (ref 3.87–5.11)
RDW: 12.6 % (ref 11.5–15.5)
WBC: 11.5 10*3/uL — ABNORMAL HIGH (ref 4.0–10.5)

## 2014-09-22 LAB — COMPREHENSIVE METABOLIC PANEL
ALT: 18 U/L (ref 0–35)
ANION GAP: 6 (ref 5–15)
AST: 37 U/L (ref 0–37)
Albumin: 2.6 g/dL — ABNORMAL LOW (ref 3.5–5.2)
Alkaline Phosphatase: 105 U/L (ref 39–117)
BUN: 21 mg/dL (ref 6–23)
CALCIUM: 8.3 mg/dL — AB (ref 8.4–10.5)
CO2: 22 mmol/L (ref 19–32)
CREATININE: 0.9 mg/dL (ref 0.50–1.10)
Chloride: 108 mmol/L (ref 96–112)
GFR, EST NON AFRICAN AMERICAN: 86 mL/min — AB (ref >90)
GLUCOSE: 103 mg/dL — AB (ref 70–99)
Potassium: 3.7 mmol/L (ref 3.5–5.1)
Sodium: 136 mmol/L (ref 135–145)
Total Bilirubin: 0.1 mg/dL — ABNORMAL LOW (ref 0.3–1.2)
Total Protein: 5.3 g/dL — ABNORMAL LOW (ref 6.0–8.3)

## 2014-09-22 LAB — RH IG WORKUP (INCLUDES ABO/RH)
ABO/RH(D): O NEG
Fetal Screen: NEGATIVE
Gestational Age(Wks): 40
Unit division: 0

## 2014-09-22 MED ORDER — IBUPROFEN 600 MG PO TABS: 600.0000 mg | ORAL_TABLET | Freq: Four times a day (QID) | ORAL | Status: DC

## 2014-09-22 MED ORDER — OXYCODONE-ACETAMINOPHEN 5-325 MG PO TABS: 1.0000 | ORAL_TABLET | ORAL | Status: DC | PRN

## 2014-09-22 NOTE — Discharge Instructions (Signed)
Discharge home with foley catheter/leg bag. Pt to be seen in office tomorrow. Will do a bladder trial in office

## 2014-09-22 NOTE — Lactation Note (Signed)
This note was copied from the chart of Amy Brock. Lactation Consultation Note; Mother has bilateral positional strips . Assist with latching infant in cross cradle hold. Mother describes moderate discomfort on the Right nipple. Several more attempts to latch and mother continued to complain of mild discomfort. Advised mother to rotate positions and use good support. Parents taught to adjust infants lower jaw for wider gape. Observed infant with a good rhythmic pattern of suckling and swallowing. Mother was given comfort gels. She has large amts of colostrum when hand express. Mother has an electric pump at home. She was advised to continue to cue base feed as well as frequent STS. Mother was informed of lactation services and community support.   Patient Name: Amy Amy Brock ZOXWR'UToday's Date: 09/22/2014 Reason for consult: Follow-up assessment   Maternal Data    Feeding Feeding Type: Breast Fed Length of feed: 10 min  LATCH Score/Interventions Latch: Grasps breast easily, tongue down, lips flanged, rhythmical sucking.  Audible Swallowing: Spontaneous and intermittent Intervention(s): Hand expression Intervention(s): Hand expression  Type of Nipple: Everted at rest and after stimulation  Comfort (Breast/Nipple): Filling, red/small blisters or bruises, mild/mod discomfort  Problem noted: Filling;Cracked, bleeding, blisters, bruises;Mild/Moderate discomfort Interventions (Filling): Firm support Interventions  (Cracked/bleeding/bruising/blister): Expressed breast milk to nipple Interventions (Mild/moderate discomfort): Comfort gels  Hold (Positioning): Assistance needed to correctly position infant at breast and maintain latch. Intervention(s): Support Pillows;Position options  LATCH Score: 8  Lactation Tools Discussed/Used     Consult Status Consult Status: Complete    Amy Brock, Amy Brock 09/22/2014, 10:51 AM

## 2014-09-22 NOTE — Progress Notes (Signed)
Post Partum Day 2 Subjective: no complaints, up ad lib, tolerating PO and foley catheter removed this am. Has not voided at the time of rounding. Denies HA, RUQ pain.  Objective: Blood pressure 138/77, pulse 70, temperature 98.3 F (36.8 C), temperature source Oral, resp. rate 20, height 5\' 6"  (1.676 m), weight 203 lb (92.08 kg), last menstrual period 12/06/2013, SpO2 97 %, unknown if currently breastfeeding.  Physical Exam:  General: alert and cooperative Lochia: appropriate Uterine Fundus: firm Incision: healing well DVT Evaluation: No evidence of DVT seen on physical exam. Negative Homan's sign. No cords or calf tenderness. Calf/Ankle edema is present.   Recent Labs  09/21/14 1149 09/22/14 0535  HGB 10.2* 9.8*  HCT 29.1* 28.1*    Assessment/Plan: Discharge home, if voiding well and post void residual as evidenced by ultrasound is normal. Will have patient return to the office on Friday for labs and recheck. Signs and symptoms of PIH reviewed with patient   LOS: 3 days   Jacquel Redditt G 09/22/2014, 8:39 AM

## 2014-09-22 NOTE — Discharge Summary (Signed)
Obstetric Discharge Summary Reason for Admission: induction of labor Prenatal Procedures: ultrasound Intrapartum Procedures: spontaneous vaginal delivery Postpartum Procedures: foley catheter Complications-Operative and Postpartum: 2 degree perineal laceration HEMOGLOBIN  Date Value Ref Range Status  09/22/2014 9.8* 12.0 - 15.0 g/dL Final   HCT  Date Value Ref Range Status  09/22/2014 28.1* 36.0 - 46.0 % Final    Physical Exam:  General: alert and cooperative Lochia: appropriate Uterine Fundus: firm Incision: healing well DVT Evaluation: No evidence of DVT seen on physical exam. Negative Homan's sign. No cords or calf tenderness. Calf/Ankle edema is present.  Discharge Diagnoses: Term Pregnancy-delivered and urinary retention  Discharge Information: Date: 09/22/2014 Activity: pelvic rest Diet: routine Medications: PNV, Ibuprofen and Percocet Condition: stable Instructions: refer to practice specific booklet Discharge to: home   Newborn Data: Live born female  Birth Weight: 8 lb 0.6 oz (3645 g) APGAR: 8, 9  Home with mother.  Amy Brock G 09/22/2014, 8:43 AM

## 2014-09-22 NOTE — Plan of Care (Signed)
Problem: Discharge Progression Outcomes Goal: Complications resolved/controlled Outcome: Completed/Met Date Met:  09/22/14 Pt to be discharged home with indwelling catheter/leg bag d/t inability to void

## 2014-09-22 NOTE — Progress Notes (Signed)
Patient unable to void after catheter removal this morning. Bladder scanned patient and reading was >600. Dr Henderson Cloudomblin notified and new orders were received

## 2014-09-23 LAB — TYPE AND SCREEN
ABO/RH(D): O NEG
Antibody Screen: POSITIVE
DAT, IgG: NEGATIVE
UNIT DIVISION: 0
Unit division: 0

## 2015-12-29 LAB — OB RESULTS CONSOLE RPR: RPR: NONREACTIVE

## 2015-12-29 LAB — OB RESULTS CONSOLE HIV ANTIBODY (ROUTINE TESTING): HIV: NONREACTIVE

## 2015-12-29 LAB — OB RESULTS CONSOLE RUBELLA ANTIBODY, IGM: Rubella: IMMUNE

## 2015-12-29 LAB — OB RESULTS CONSOLE ABO/RH: ABO/RH(D): NEGATIVE

## 2015-12-29 LAB — OB RESULTS CONSOLE ANTIBODY SCREEN: Antibody Screen: NEGATIVE

## 2015-12-29 LAB — OB RESULTS CONSOLE HEPATITIS B SURFACE ANTIGEN: Hepatitis B Surface Ag: NEGATIVE

## 2016-01-12 LAB — OB RESULTS CONSOLE GC/CHLAMYDIA
Chlamydia: NEGATIVE
GC PROBE AMP, GENITAL: NEGATIVE

## 2016-01-17 ENCOUNTER — Inpatient Hospital Stay (HOSPITAL_COMMUNITY)
Admission: AD | Admit: 2016-01-17 | Payer: BLUE CROSS/BLUE SHIELD | Source: Ambulatory Visit | Admitting: Obstetrics and Gynecology

## 2016-07-04 LAB — OB RESULTS CONSOLE GBS: STREP GROUP B AG: POSITIVE

## 2016-07-14 ENCOUNTER — Inpatient Hospital Stay (HOSPITAL_COMMUNITY)
Admission: AD | Admit: 2016-07-14 | Discharge: 2016-07-14 | Disposition: A | Discharge status: Home / Self Care | Payer: BLUE CROSS/BLUE SHIELD | Source: Ambulatory Visit | Attending: Obstetrics and Gynecology | Admitting: Obstetrics and Gynecology

## 2016-07-14 ENCOUNTER — Encounter (HOSPITAL_COMMUNITY): Payer: Self-pay

## 2016-07-14 DIAGNOSIS — O26853 Spotting complicating pregnancy, third trimester: Secondary | ICD-10-CM | POA: Diagnosis present

## 2016-07-14 DIAGNOSIS — O4693 Antepartum hemorrhage, unspecified, third trimester: Secondary | ICD-10-CM

## 2016-07-14 DIAGNOSIS — Z3A37 37 weeks gestation of pregnancy: Secondary | ICD-10-CM | POA: Insufficient documentation

## 2016-07-14 NOTE — [facility] Provider Note (Signed)
History   G2P1001 @ 37.1 wks in with c/o brownish vag spotting since yesterday. Denies active bright vag bleeding or ROM. States good fetal movement.  CSN: [id number]  Arrival date & time 07/14/16  1156   None     Chief Complaint  Patient presents with  . Vaginal Discharge  . Abdominal Pain    HPI  Past Medical History:  Diagnosis Date  . Pregnancy induced hypertension   . THYROID CYST 10/28/2008  . THYROIDITIS, HX OF 2010  . Vaginal Pap smear, abnormal   . VSD    congenital, closed spont    Past Surgical History:  Procedure Laterality Date  . ANTERIOR CRUCIATE LIGAMENT REPAIR  age 30  . TONSILLECTOMY     @ 11/ post op hemorrhage    Family History  Problem Relation Age of Onset  . Breast cancer Maternal Aunt   . Cancer Maternal Aunt     breast  . Hypertension Maternal Grandmother     MI in 3980's & CHF  . Heart disease Maternal Grandmother   . Hypertension Maternal Grandfather     ? MI @ 85  . Heart disease Maternal Grandfather   . Hypertension Paternal Grandfather     MI in 6480's & CHF  . Heart disease Paternal Grandfather   . Heart disease Paternal Grandmother     Social History  Substance Use Topics  . Smoking status: Never Smoker  . 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 Alcohol use Yes     Comment: occassional    OB History    Gravida Para Term Preterm AB Living   2 1 1     1    SAB TAB Ectopic Multiple Live Births         0 1      Review of Systems  Constitutional: Negative.   HENT: Negative.   Eyes: Negative.   Respiratory: Negative.   Cardiovascular: Negative.   Gastrointestinal: Negative.   Endocrine: Negative.   Genitourinary: Positive for vaginal bleeding.  Musculoskeletal: Negative.   Skin: Negative.   Allergic/Immunologic: Negative.   Neurological: Negative.   Hematological: Negative.   Psychiatric/Behavioral: Negative.     Allergies  Patient has no known allergies.  Home Medications    BP  107/62 (BP Location: Left Arm)   Pulse 62   Temp 97.8 F (36.6 C) (Oral)   Resp 16   SpO2 99%   Physical Exam  Constitutional: She is oriented to person, place, and time. She appears well-developed and well-nourished.  HENT:  Head: Normocephalic.  Eyes: Pupils are equal, round, and reactive to light.  Neck: Normal range of motion.  Cardiovascular: Normal rate, regular rhythm, normal heart sounds and intact distal pulses.   Pulmonary/Chest: Effort normal and breath sounds normal.  Abdominal: Soft. Bowel sounds are normal.  Genitourinary: Vagina normal and uterus normal.  Genitourinary Comments: Scant amt dark brown old blood on exam gove. No active bleeding.  Musculoskeletal: Normal range of motion.  Neurological: She is alert and oriented to person, place, and time. She has normal reflexes.  Skin: Skin is warm and dry.  Psychiatric: She has a normal mood and affect. Her behavior is normal. Judgment and thought content normal.    MAU Course  Procedures (including critical care time)  Labs Reviewed - No data to display No results found.   1. Vaginal bleeding in pregnancy, third trimester       MDM  Normal  show for thinning cervix at term. SVE ft/th/post/high. Scant amt dark brown show on exam glove. FHR pattern reassurring. POC discussed with Dr. Elon SpannerLeger pt to be d/c home not in labor

## 2016-07-14 NOTE — Discharge Instructions (Signed)
Vaginal Delivery Vaginal delivery means that you will give birth by pushing your baby out of your birth canal (vagina). A team of health care providers will help you before, during, and after vaginal delivery. Birth experiences are unique for every woman and every pregnancy, and birth experiences vary depending on where you choose to give birth. What should I do to prepare for my baby's birth? Before your baby is born, it is important to talk with your health care provider about:  Your labor and delivery preferences. These may include:  Medicines that you may be given.  How you will manage your pain. This might include non-medical pain relief techniques or injectable pain relief such as epidural analgesia.  How you and your baby will be monitored during labor and delivery.  Who may be in the labor and delivery room with you.  Your feelings about surgical delivery of your baby (cesarean delivery, or C-section) if this becomes necessary.  Your feelings about receiving donated blood through an IV tube (blood transfusion) if this becomes necessary.  Whether you are able:  To take pictures or videos of the birth.  To eat during labor and delivery.  To move around, walk, or change positions during labor and delivery.  What to expect after your baby is born, such as:  Whether delayed umbilical cord clamping and cutting is offered.  Who will care for your baby right after birth.  Medicines or tests that may be recommended for your baby.  Whether breastfeeding is supported in your hospital or birth center.  How long you will be in the hospital or birth center.  How any medical conditions you have may affect your baby or your labor and delivery experience. To prepare for your baby's birth, you should also:  Attend all of your health care visits before delivery (prenatal visits) as recommended by your health care provider. This is important.  Prepare your home for your baby's  arrival. Make sure that you have:  Diapers.  Baby clothing.  Feeding equipment.  Safe sleeping arrangements for you and your baby.  Install a car seat in your vehicle. Have your car seat checked by a certified car seat installer to make sure that it is installed safely.  Think about who will help you with your new baby at home for at least the first several weeks after delivery. What can I expect when I arrive at the birth center or hospital? Once you are in labor and have been admitted into the hospital or birth center, your health care provider may:  Review your pregnancy history and any concerns you have.  Insert an IV tube into one of your veins. This is used to give you fluids and medicines.  Check your blood pressure, pulse, temperature, and heart rate (vital signs).  Check whether your bag of water (amniotic sac) has broken (ruptured).  Talk with you about your birth plan and discuss pain control options. Monitoring Your health care provider may monitor your contractions (uterine monitoring) and your baby's heart rate (fetal monitoring). You may need to be monitored:  Often, but not continuously (intermittently).  All the time or for long periods at a time (continuously). Continuous monitoring may be needed if:  You are taking certain medicines, such as medicine to relieve pain or make your contractions stronger.  You have pregnancy or labor complications. Monitoring may be done by:  Placing a special stethoscope or a handheld monitoring device on your abdomen to check your baby's heartbeat,  and feeling your abdomen for contractions. This method of monitoring does not continuously record your baby's heartbeat or your contractions.  Placing monitors on your abdomen (external monitors) to record your baby's heartbeat and the frequency and length of contractions. You may not have to wear external monitors all the time.  Placing monitors inside of your uterus (internal  monitors) to record your baby's heartbeat and the frequency, length, and strength of your contractions.  Your health care provider may use internal monitors if he or she needs more information about the strength of your contractions or your baby's heart rate.  Internal monitors are put in place by passing a thin, flexible wire through your vagina and into your uterus. Depending on the type of monitor, it may remain in your uterus or on your baby's head until birth.  Your health care provider will discuss the benefits and risks of internal monitoring with you and will ask for your permission before inserting the monitors.  Telemetry. This is a type of continuous monitoring that can be done with external or internal monitors. Instead of having to stay in bed, you are able to move around during telemetry. Ask your health care provider if telemetry is an option for you. Physical exam Your health care provider may perform a physical exam. This may include:  Checking whether your baby is positioned:  With the head toward your vagina (head-down). This is most common.  With the head toward the top of your uterus (head-up or breech). If your baby is in a breech position, your health care provider may try to turn your baby to a head-down position so you can deliver vaginally. If it does not seem that your baby can be born vaginally, your provider may recommend surgery to deliver your baby. In rare cases, you may be able to deliver vaginally if your baby is head-up (breech delivery).  Lying sideways (transverse). Babies that are lying sideways cannot be delivered vaginally.  Checking your cervix to determine:  Whether it is thinning out (effacing).  Whether it is opening up (dilating).  How low your baby has moved into your birth canal. What are the three stages of labor and delivery?   Normal labor and delivery is divided into the following three stages: Stage 1  Stage 1 is the longest stage of  labor, and it can last for hours or days. Stage 1 includes:  Early labor. This is when contractions may be irregular, or regular and mild. Generally, early labor contractions are more than 10 minutes apart.  Active labor. This is when contractions get longer, more regular, more frequent, and more intense.  The transition phase. This is when contractions happen very close together, are very intense, and may last longer than during any other part of labor.  Contractions generally feel mild, infrequent, and irregular at first. They get stronger, more frequent (about every 2-3 minutes), and more regular as you progress from early labor through active labor and transition.  Many women progress through stage 1 naturally, but you may need help to continue making progress. If this happens, your health care provider may talk with you about:  Rupturing your amniotic sac if it has not ruptured yet.  Giving you medicine to help make your contractions stronger and more frequent.  Stage 1 ends when your cervix is completely dilated to 4 inches (10 cm) and completely effaced. This happens at the end of the transition phase. Stage 2  Once your cervix is completely effaced  and dilated to 4 inches (10 cm), you may start to feel an urge to push. It is common for the body to naturally take a rest before feeling the urge to push, especially if you received an epidural or certain other pain medicines. This rest period may last for up to 1-2 hours, depending on your unique labor experience.  During stage 2, contractions are generally less painful, because pushing helps relieve contraction pain. Instead of contraction pain, you may feel stretching and burning pain, especially when the widest part of your baby's head passes through the vaginal opening (crowning).  Your health care provider will closely monitor your pushing progress and your baby's progress through the vagina during stage 2.  Your health care  provider may massage the area of skin between your vaginal opening and anus (perineum) or apply warm compresses to your perineum. This helps it stretch as the baby's head starts to crown, which can help prevent perineal tearing.  In some cases, an incision may be made in your perineum (episiotomy) to allow the baby to pass through the vaginal opening. An episiotomy helps to make the opening of the vagina larger to allow more room for the baby to fit through.  It is very important to breathe and focus so your health care provider can control the delivery of your baby's head. Your health care provider may have you decrease the intensity of your pushing, to help prevent perineal tearing.  After delivery of your baby's head, the shoulders and the rest of the body generally deliver very quickly and without difficulty.  Once your baby is delivered, the umbilical cord may be cut right away, or this may be delayed for 1-2 minutes, depending on your baby's health. This may vary among health care providers, hospitals, and birth centers.  If you and your baby are healthy enough, your baby may be placed on your chest or abdomen to help maintain the baby's temperature and to help you bond with each other. Some mothers and babies start breastfeeding at this time. Your health care team will dry your baby and help keep your baby warm during this time.  Your baby may need immediate care if he or she:  Showed signs of distress during labor.  Has a medical condition.  Was born too early (prematurely).  Had a bowel movement before birth (meconium).  Shows signs of difficulty transitioning from being inside the uterus to being outside of the uterus. If you are planning to breastfeed, your health care team will help you begin a feeding. Stage 3  The third stage of labor starts immediately after the birth of your baby and ends after you deliver the placenta. The placenta is an organ that develops during pregnancy  to provide oxygen and nutrients to your baby in the womb.  Delivering the placenta may require some pushing, and you may have mild contractions. Breastfeeding can stimulate contractions to help you deliver the placenta.  After the placenta is delivered, your uterus should tighten (contract) and become firm. This helps to stop bleeding in your uterus. To help your uterus contract and to control bleeding, your health care provider may:  Give you medicine by injection, through an IV tube, by mouth, or through your rectum (rectally).  Massage your abdomen or perform a vaginal exam to remove any blood clots that are left in your uterus.  Empty your bladder by placing a thin, flexible tube (catheter) into your bladder.  Encourage you to breastfeed your baby.  After labor is over, you and your baby will be monitored closely to ensure that you are both healthy until you are ready to go home. Your health care team will teach you how to care for yourself and your baby. This information is not intended to replace advice given to you by your health care provider. Make sure you discuss any questions you have with your health care provider. Document Released: 05/08/2008 Document Revised: 02/17/2016 Document Reviewed: 08/14/2015 Elsevier Interactive Patient Education  2017 [employer]. Third Trimester of Pregnancy The third trimester is from week 29 through week 40 (months 7 through 9). The third trimester is a time when the unborn baby (fetus) is growing rapidly. At the end of the ninth month, the fetus is about 20 inches in length and weighs 6-10 pounds. Body changes during your third trimester Your body goes through many changes during pregnancy. The changes vary from woman to woman. During the third trimester:  Your weight will continue to increase. You can expect to gain 25-35 pounds (11-16 kg) by the end of the pregnancy.  You may begin to get stretch marks on your hips, abdomen, and breasts.  You  may urinate more often because the fetus is moving lower into your pelvis and pressing on your bladder.  You may develop or continue to have heartburn. This is caused by increased hormones that slow down muscles in the digestive tract.  You may develop or continue to have constipation because increased hormones slow digestion and cause the muscles that push waste through your intestines to relax.  You may develop hemorrhoids. These are swollen veins (varicose veins) in the rectum that can itch or be painful.  You may develop swollen, bulging veins (varicose veins) in your legs.  You may have increased body aches in the pelvis, back, or thighs. This is due to weight gain and increased hormones that are relaxing your joints.  You may have changes in your hair. These can include thickening of your hair, rapid growth, and changes in texture. Some women also have hair loss during or after pregnancy, or hair that feels dry or thin. Your hair will most likely return to normal after your baby is born.  Your breasts will continue to grow and they will continue to become tender. A yellow fluid (colostrum) may leak from your breasts. This is the first milk you are producing for your baby.  Your belly button may stick out.  You may notice more swelling in your hands, face, or ankles.  You may have increased tingling or numbness in your hands, arms, and legs. The skin on your belly may also feel numb.  You may feel short of breath because of your expanding uterus.  You may have more problems sleeping. This can be caused by the size of your belly, increased need to urinate, and an increase in your body's metabolism.  You may notice the fetus "dropping," or moving lower in your abdomen.  You may have increased vaginal discharge.  Your cervix becomes thin and soft (effaced) near your due date. What to expect at prenatal visits You will have prenatal exams every 2 weeks until week 36. Then you will  have weekly prenatal exams. During a routine prenatal visit:  You will be weighed to make sure you and the fetus are growing normally.  Your blood pressure will be taken.  Your abdomen will be measured to track your baby's growth.  The fetal heartbeat will be listened to.  Any  test results from the previous visit will be discussed.  You may have a cervical check near your due date to see if you have effaced. At around 36 weeks, your health care provider will check your cervix. At the same time, your health care provider will also perform a test on the secretions of the vaginal tissue. This test is to determine if a type of bacteria, Group B streptococcus, is present. Your health care provider will explain this further. Your health care provider may ask you:  What your birth plan is.  How you are feeling.  If you are feeling the baby move.  If you have had any abnormal symptoms, such as leaking fluid, bleeding, severe headaches, or abdominal cramping.  If you are using any tobacco products, including cigarettes, chewing tobacco, and electronic cigarettes.  If you have any questions. Other tests or screenings that may be performed during your third trimester include:  Blood tests that check for low iron levels (anemia).  Fetal testing to check the health, activity level, and growth of the fetus. Testing is done if you have certain medical conditions or if there are problems during the pregnancy.  Nonstress test (NST). This test checks the health of your baby to make sure there are no signs of problems, such as the baby not getting enough oxygen. During this test, a belt is placed around your belly. The baby is made to move, and its heart rate is monitored during movement. What is false labor? False labor is a condition in which you feel small, irregular tightenings of the muscles in the womb (contractions) that eventually go away. These are called Braxton Hicks contractions.  Contractions may last for hours, days, or even weeks before true labor sets in. If contractions come at regular intervals, become more frequent, increase in intensity, or become painful, you should see your health care provider. What are the signs of labor?  Abdominal cramps.  Regular contractions that start at 10 minutes apart and become stronger and more frequent with time.  Contractions that start on the top of the uterus and spread down to the lower abdomen and back.  Increased pelvic pressure and dull back pain.  A watery or bloody mucus discharge that comes from the vagina.  Leaking of amniotic fluid. This is also known as your "water breaking." It could be a slow trickle or a gush. Let your doctor know if it has a color or strange odor. If you have any of these signs, call your health care provider right away, even if it is before your due date. Follow these instructions at home: Eating and drinking  Continue to eat regular, healthy meals.  Do not eat:  Raw meat or meat spreads.  Unpasteurized milk or cheese.  Unpasteurized juice.  Store-made salad.  Refrigerated smoked seafood.  Hot dogs or deli meat, unless they are piping hot.  More than 6 ounces of albacore tuna a week.  Shark, swordfish, king mackerel, or tile fish.  Store-made salads.  Raw sprouts, such as mung bean or alfalfa sprouts.  Take prenatal vitamins as told by your health care provider.  Take 1000 mg of calcium daily as told by your health care provider.  If you develop constipation:  Take over-the-counter or prescription medicines.  Drink enough fluid to keep your urine clear or pale yellow.  Eat foods that are high in fiber, such as fresh fruits and vegetables, whole grains, and beans.  Limit foods that are high in fat  and processed sugars, such as fried and sweet foods. Activity  Exercise only as directed by your health care provider. Healthy pregnant women should aim for 2 hours and  30 minutes of moderate exercise per week. If you experience any pain or discomfort while exercising, stop.  Avoid heavy lifting.  Do not exercise in extreme heat or humidity, or at high altitudes.  Wear low-heel, comfortable shoes.  Practice good posture.  Do not travel far distances unless it is absolutely necessary and only with the approval of your health care provider.  Wear your seat belt at all times while in a car, on a bus, or on a plane.  Take frequent breaks and rest with your legs elevated if you have leg cramps or low back pain.  Do not use hot tubs, steam rooms, or saunas.  You may continue to have sex unless your health care provider tells you otherwise. Lifestyle  Do not use any products that contain nicotine or tobacco, such as cigarettes and e-cigarettes. If you need help quitting, ask your health care provider.  Do not drink alcohol.  Do not use any medicinal herbs or unprescribed drugs. These chemicals affect the formation and growth of the baby.  If you develop varicose veins:  Wear support pantyhose or compression stockings as told by your healthcare provider.  Elevate your feet for 15 minutes, 3-4 times a day.  Wear a supportive maternity bra to help with breast tenderness. General instructions  Take over-the-counter and prescription medicines only as told by your health care provider. There are medicines that are either safe or unsafe to take during pregnancy.  Take warm sitz baths to soothe any pain or discomfort caused by hemorrhoids. Use hemorrhoid cream or witch hazel if your health care provider approves.  Avoid cat litter boxes and soil used by cats. These carry germs that can cause birth defects in the baby. If you have a cat, ask someone to clean the litter box for you.  To prepare for the arrival of your baby:  Take prenatal classes to understand, practice, and ask questions about the labor and delivery.  Make a trial run to the  hospital.  Visit the hospital and tour the maternity area.  Arrange for maternity or paternity leave through employers.  Arrange for family and friends to take care of pets while you are in the hospital.  Purchase a rear-facing car seat and make sure you know how to install it in your car.  Pack your hospital bag.  Prepare the babys nursery. Make sure to remove all pillows and stuffed animals from the baby's crib to prevent suffocation.  Visit your dentist if you have not gone during your pregnancy. Use a soft toothbrush to brush your teeth and be gentle when you floss.  Keep all prenatal follow-up visits as told by your health care provider. This is important. Contact a health care provider if:  You are unsure if you are in labor or if your water has broken.  You become dizzy.  You have mild pelvic cramps, pelvic pressure, or nagging pain in your abdominal area.  You have lower back pain.  You have persistent nausea, vomiting, or diarrhea.  You have an unusual or bad smelling vaginal discharge.  You have pain when you urinate. Get help right away if:  You have a fever.  You are leaking fluid from your vagina.  You have spotting or bleeding from your vagina.  You have severe abdominal pain or cramping.  You have rapid weight loss or weight gain.  You have shortness of breath with chest pain.  You notice sudden or extreme swelling of your face, hands, ankles, feet, or legs.  Your baby makes fewer than 10 movements in 2 hours.  You have severe headaches that do not go away with medicine.  You have vision changes. Summary  The third trimester is from week 29 through week 40, months 7 through 9. The third trimester is a time when the unborn baby (fetus) is growing rapidly.  During the third trimester, your discomfort may increase as you and your baby continue to gain weight. You may have abdominal, leg, and back pain, sleeping problems, and an increased need to  urinate.  During the third trimester your breasts will keep growing and they will continue to become tender. A yellow fluid (colostrum) may leak from your breasts. This is the first milk you are producing for your baby.  False labor is a condition in which you feel small, irregular tightenings of the muscles in the womb (contractions) that eventually go away. These are called Braxton Hicks contractions. Contractions may last for hours, days, or even weeks before true labor sets in.  Signs of labor can include: abdominal cramps; regular contractions that start at 10 minutes apart and become stronger and more frequent with time; watery or bloody mucus discharge that comes from the vagina; increased pelvic pressure and dull back pain; and leaking of amniotic fluid. This information is not intended to replace advice given to you by your health care provider. Make sure you discuss any questions you have with your health care provider. Document Released: 07/24/2001 Document Revised: 01/05/2016 Document Reviewed: 09/30/2012 Elsevier Interactive Patient Education  2017 [employer] Introduction Patient Name: ________________________________________________ Patient Due Date: ____________________ What is a fetal movement count? A fetal movement count is the number of times that you feel your baby move during a certain amount of time. This may also be called a fetal kick count. A fetal movement count is recommended for every pregnant woman. You may be asked to start counting fetal movements as early as week 28 of your pregnancy. Pay attention to when your baby is most active. You may notice your baby's sleep and wake cycles. You may also notice things that make your baby move more. You should do a fetal movement count:  When your baby is normally most active.  At the same time each day. A good time to count movements is while you are resting, after having something to eat and drink. How do I count fetal  movements? 1. Find a quiet, comfortable area. Sit, or lie down on your side. 2. Write down the date, the start time and stop time, and the number of movements that you felt between those two times. Take this information with you to your health care visits. 3. For 2 hours, count kicks, flutters, swishes, rolls, and jabs. You should feel at least 10 movements during 2 hours. 4. You may stop counting after you have felt 10 movements. 5. If you do not feel 10 movements in 2 hours, have something to eat and drink. Then, keep resting and counting for 1 hour. If you feel at least 4 movements during that hour, you may stop counting. Contact a health care provider if:  You feel fewer than 4 movements in 2 hours.  Your baby is not moving like he or she usually does. Date: ____________ Start time: ____________ Stop time: ____________ Movements: ____________  Date: ____________ Start time: ____________ Stop time: ____________ Movements: ____________ Date: ____________ Start time: ____________ Stop time: ____________ Movements: ____________ Date: ____________ Start time: ____________ Stop time: ____________ Movements: ____________ Date: ____________ Start time: ____________ Stop time: ____________ Movements: ____________ Date: ____________ Start time: ____________ Stop time: ____________ Movements: ____________ Date: ____________ Start time: ____________ Stop time: ____________ Movements: ____________ Date: ____________ Start time: ____________ Stop time: ____________ Movements: ____________ Date: ____________ Start time: ____________ Stop time: ____________ Movements: ____________ This information is not intended to replace advice given to you by your health care provider. Make sure you discuss any questions you have with your health care provider. Document Released: 08/29/2006 Document Revised: 03/28/2016 Document Reviewed: 09/08/2015 Elsevier Interactive Patient Education  2017 [employer].

## 2016-07-14 NOTE — MAU Note (Signed)
Pt explained that yesterday she has two episodes of brown discharge and was told that she needed to be seen if the discharge continued.  Pt states that today she had a bowel movement and then a little while afterwards she saw more brownish discharge.  Pt states she is not having any contractions.  Pt states that she is having some sharp/stabbing lower abdominal pain.

## 2016-08-06 ENCOUNTER — Encounter (HOSPITAL_COMMUNITY): Payer: Self-pay | Admitting: Certified Nurse Midwife

## 2016-08-06 ENCOUNTER — Inpatient Hospital Stay (HOSPITAL_COMMUNITY)
Admission: AD | Admit: 2016-08-06 | Discharge: 2016-08-07 | DRG: 775 | Disposition: A | Discharge status: Home / Self Care | Payer: BLUE CROSS/BLUE SHIELD | Source: Ambulatory Visit | Attending: Obstetrics and Gynecology | Admitting: Obstetrics and Gynecology

## 2016-08-06 DIAGNOSIS — Z3493 Encounter for supervision of normal pregnancy, unspecified, third trimester: Secondary | ICD-10-CM | POA: Diagnosis present

## 2016-08-06 DIAGNOSIS — O99824 Streptococcus B carrier state complicating childbirth: Secondary | ICD-10-CM | POA: Diagnosis present

## 2016-08-06 DIAGNOSIS — Z8249 Family history of ischemic heart disease and other diseases of the circulatory system: Secondary | ICD-10-CM | POA: Diagnosis not present

## 2016-08-06 DIAGNOSIS — Z3A40 40 weeks gestation of pregnancy: Secondary | ICD-10-CM | POA: Diagnosis not present

## 2016-08-06 LAB — CBC
HCT: 37.5 % (ref 36.0–46.0)
HEMOGLOBIN: 13.5 g/dL (ref 12.0–15.0)
MCH: 32.8 pg (ref 26.0–34.0)
MCHC: 36 g/dL (ref 30.0–36.0)
MCV: 91.2 fL (ref 78.0–100.0)
Platelets: 168 10*3/uL (ref 150–400)
RBC: 4.11 MIL/uL (ref 3.87–5.11)
RDW: 12.4 % (ref 11.5–15.5)
WBC: 27.4 10*3/uL — ABNORMAL HIGH (ref 4.0–10.5)

## 2016-08-06 LAB — RPR: RPR: NONREACTIVE

## 2016-08-06 MED ORDER — OXYTOCIN 10 UNIT/ML IJ SOLN
INTRAMUSCULAR | Status: AC
  Administered 2016-08-06: 10 [IU]
  Filled 2016-08-06: qty 2

## 2016-08-06 MED ORDER — WITCH HAZEL-GLYCERIN EX PADS: 1.0000 "application " | MEDICATED_PAD | CUTANEOUS | Status: DC | PRN

## 2016-08-06 MED ORDER — OXYTOCIN 10 UNIT/ML IJ SOLN: 10.0000 [IU] | Freq: Once | INTRAMUSCULAR | Status: DC

## 2016-08-06 MED ORDER — LACTATED RINGERS IV SOLN: INTRAVENOUS | Status: DC

## 2016-08-06 MED ORDER — OXYCODONE HCL 5 MG PO TABS: 10.0000 mg | ORAL_TABLET | ORAL | Status: DC | PRN

## 2016-08-06 MED ORDER — COCONUT OIL OIL
1.0000 "application " | TOPICAL_OIL | Status: DC | PRN
  Filled 2016-08-06: qty 120

## 2016-08-06 MED ORDER — ACETAMINOPHEN 325 MG PO TABS
650.0000 mg | ORAL_TABLET | ORAL | Status: DC | PRN
Start: 2016-08-06 — End: 2016-08-06

## 2016-08-06 MED ORDER — BENZOCAINE-MENTHOL 20-0.5 % EX AERO
1.0000 | INHALATION_SPRAY | CUTANEOUS | Status: DC | PRN
Start: 2016-08-06 — End: 2016-08-07
  Administered 2016-08-07: 1 via TOPICAL
  Filled 2016-08-06 (×2): qty 56

## 2016-08-06 MED ORDER — SOD CITRATE-CITRIC ACID 500-334 MG/5ML PO SOLN: 30.0000 mL | ORAL | Status: DC | PRN

## 2016-08-06 MED ORDER — DIBUCAINE 1 % RE OINT: 1.0000 "application " | TOPICAL_OINTMENT | RECTAL | Status: DC | PRN

## 2016-08-06 MED ORDER — ONDANSETRON HCL 4 MG/2ML IJ SOLN: 4.0000 mg | INTRAMUSCULAR | Status: DC | PRN

## 2016-08-06 MED ORDER — OXYCODONE-ACETAMINOPHEN 5-325 MG PO TABS: 1.0000 | ORAL_TABLET | ORAL | Status: DC | PRN

## 2016-08-06 MED ORDER — LACTATED RINGERS IV SOLN: 500.0000 mL | INTRAVENOUS | Status: DC | PRN

## 2016-08-06 MED ORDER — IBUPROFEN 600 MG PO TABS
600.0000 mg | ORAL_TABLET | Freq: Four times a day (QID) | ORAL | Status: DC
  Administered 2016-08-06 – 2016-08-07 (×7): 600 mg via ORAL
  Filled 2016-08-06 (×7): qty 1

## 2016-08-06 MED ORDER — SENNOSIDES-DOCUSATE SODIUM 8.6-50 MG PO TABS
2.0000 | ORAL_TABLET | ORAL | Status: DC
  Administered 2016-08-06: 2 via ORAL
  Filled 2016-08-06: qty 2

## 2016-08-06 MED ORDER — ZOLPIDEM TARTRATE 5 MG PO TABS: 5.0000 mg | ORAL_TABLET | Freq: Every evening | ORAL | Status: DC | PRN

## 2016-08-06 MED ORDER — ONDANSETRON HCL 4 MG/2ML IJ SOLN: 4.0000 mg | Freq: Four times a day (QID) | INTRAMUSCULAR | Status: DC | PRN

## 2016-08-06 MED ORDER — OXYTOCIN 40 UNITS IN LACTATED RINGERS INFUSION - SIMPLE MED
INTRAVENOUS | Status: AC
  Filled 2016-08-06: qty 1000

## 2016-08-06 MED ORDER — OXYTOCIN BOLUS FROM INFUSION: 500.0000 mL | Freq: Once | INTRAVENOUS | Status: DC

## 2016-08-06 MED ORDER — OXYCODONE HCL 5 MG PO TABS: 5.0000 mg | ORAL_TABLET | ORAL | Status: DC | PRN

## 2016-08-06 MED ORDER — ACETAMINOPHEN 325 MG PO TABS
650.0000 mg | ORAL_TABLET | ORAL | Status: DC | PRN
  Administered 2016-08-06: 650 mg via ORAL
  Filled 2016-08-06: qty 2

## 2016-08-06 MED ORDER — OXYCODONE-ACETAMINOPHEN 5-325 MG PO TABS: 2.0000 | ORAL_TABLET | ORAL | Status: DC | PRN

## 2016-08-06 MED ORDER — ONDANSETRON HCL 4 MG PO TABS: 4.0000 mg | ORAL_TABLET | ORAL | Status: DC | PRN

## 2016-08-06 MED ORDER — LIDOCAINE HCL (PF) 1 % IJ SOLN
INTRAMUSCULAR | Status: AC
  Filled 2016-08-06: qty 30

## 2016-08-06 MED ORDER — OXYTOCIN 40 UNITS IN LACTATED RINGERS INFUSION - SIMPLE MED: 2.5000 [IU]/h | INTRAVENOUS | Status: DC

## 2016-08-06 MED ORDER — PRENATAL MULTIVITAMIN CH
1.0000 | ORAL_TABLET | Freq: Every day | ORAL | Status: DC
  Administered 2016-08-06 – 2016-08-07 (×2): 1 via ORAL
  Filled 2016-08-06 (×2): qty 1

## 2016-08-06 MED ORDER — FLEET ENEMA 7-19 GM/118ML RE ENEM: 1.0000 | ENEMA | RECTAL | Status: DC | PRN

## 2016-08-06 MED ORDER — LIDOCAINE HCL (PF) 1 % IJ SOLN
30.0000 mL | INTRAMUSCULAR | Status: DC | PRN
  Administered 2016-08-06: 30 mL via SUBCUTANEOUS
  Filled 2016-08-06: qty 30

## 2016-08-06 MED ORDER — SIMETHICONE 80 MG PO CHEW: 80.0000 mg | CHEWABLE_TABLET | ORAL | Status: DC | PRN

## 2016-08-06 MED ORDER — TETANUS-DIPHTH-ACELL PERTUSSIS 5-2.5-18.5 LF-MCG/0.5 IM SUSP: 0.5000 mL | Freq: Once | INTRAMUSCULAR | Status: DC

## 2016-08-06 MED ORDER — DIPHENHYDRAMINE HCL 25 MG PO CAPS: 25.0000 mg | ORAL_CAPSULE | Freq: Four times a day (QID) | ORAL | Status: DC | PRN

## 2016-08-06 NOTE — Progress Notes (Signed)
Delivery Note At 4:26 AM a viable female was delivered via Vaginal, Spontaneous Delivery (Presentation:LOA ;  ).  APGAR: 7, 8; weight  .   Placenta status: , .  Cord:  with the following complications: .  Cord pH: not done Rapid second stage  Delivery on all fours After 1 minute, cord clamped and cut Patient repositioned in dorsal lithotomy position Placenta delivered intact   Anesthesia: 1% lidocaine local  Episiotomy: None Lacerations: 2nd degree left sulcus laceration extending to left labial sulcus-repaired. Rectum checked and intact Suture Repair: 2.0 vicryl rapide Est. Blood Loss (mL):  400  Mom to postpartum.  Baby to Couplet care / Skin to Skin.  Brynnlie Unterreiner II,Anadalay Macdonell E 08/06/2016, 5:01 AM

## 2016-08-06 NOTE — Lactation Note (Addendum)
This note was copied from a baby's chart. Lactation Consultation Note: Lactation Brochure given with review of basic breastfeeding. Infant is 7 hours old. Infant had a good 30 min feeding at 5am. Mother concerned that infant has not fed since 5. She states that he just sucks one time and the falls asleep. Mother breastfed her last child for 15 months. She states that this is a lot different than breastfeeding an older child. Lots of tips on latching and positions. Mother has large flow of colostrum. Infant was spoon fed 1-2 ml of colostrum. Advised mother to rouse infant if not showing feeding cues. Mother receptive to all teaching. She is aware of LC services. Mother will page LC if needed for next feeding attempt.   Patient Name: Amy Brock [name] Comes ZOXWR'UToday's Date: 08/06/2016 Reason for consult: Initial assessment   Maternal Data    Feeding Feeding Type: Breast Milk  LATCH Score/Interventions                      Lactation Tools Discussed/Used     Consult Status Consult Status: Follow-up Date: 08/06/16 Follow-up type: In-patient    Stevan BornKendrick, [place] [facility] 08/06/2016, 12:00 PM

## 2016-08-06 NOTE — H&P (Signed)
Nelida Meusebigail C Krusemark is a 30 y.o. female presenting for UCs. In MAU cervix is C/C/+2 with BBOW. Transported to L&D. OB History    Gravida Para Term Preterm AB Living   2 1 1     1    SAB TAB Ectopic Multiple Live Births         0 1     Past Medical History:  Diagnosis Date  . Pregnancy induced hypertension   . THYROID CYST 10/28/2008  . THYROIDITIS, HX OF 2010  . Vaginal Pap smear, abnormal   . VSD    congenital, closed spont   Past Surgical History:  Procedure Laterality Date  . ANTERIOR CRUCIATE LIGAMENT REPAIR  age 30  . TONSILLECTOMY     @ 11/ post op hemorrhage   Family History: family history includes Breast cancer in her maternal aunt; Cancer in her maternal aunt; Heart disease in her maternal grandfather, maternal grandmother, paternal grandfather, and paternal grandmother; Hypertension in her maternal grandfather, maternal grandmother, and paternal grandfather. Social History:  reports that she has never smoked. She has never used smokeless tobacco. She reports that she drinks alcohol. She reports that she does not use drugs.     Maternal Diabetes: No Genetic Screening: Normal Maternal Ultrasounds/Referrals: Normal Fetal Ultrasounds or other Referrals:  None Maternal Substance Abuse:  No Significant Maternal Medications:  None Significant Maternal Lab Results:  None Other Comments:  None  ROS Maternal Medical History:  Reason for admission: Contractions.   Contractions: Onset was 1-2 hours ago.    Fetal activity: Perceived fetal activity is normal.      Dilation: 10 Effacement (%): 100 Station: +2, +3 Exam by:: Dr Omer JackMumaw  Temperature 98.9 F (37.2 C), temperature source Oral, height 5\' 6"  (1.676 m), weight 192 lb (87.1 kg), unknown if currently breastfeeding. Maternal Exam:  Uterine Assessment: Contraction strength is firm.  Contraction frequency is regular.   Abdomen: Fetal presentation: vertex     Physical Exam  Cardiovascular: Normal rate.    Respiratory: Effort normal.  GI: Soft.    Upon my arrival to labor room patient on all fours pushing with vertex crowning.  Prenatal labs: ABO, Rh: O/--/neg (05/18 0000) Antibody: Negative (05/18 0000) Rubella: Immune (05/18 0000) RPR: Nonreactive (05/18 0000)  HBsAg: Negative (05/18 0000)  HIV: Non-reactive (05/18 0000)  GBS: Positive (11/22 0000)   Assessment/Plan: 30 yo G2P1 in active labor GBBS positive Upon my arrival as above, crowning in all four position with no IV acces   Patrece Tallie II,Deangleo Passage E 08/06/2016, 4:58 AM

## 2016-08-06 NOTE — Progress Notes (Signed)
S: No complaints. Feeling well. Lochia appropriate. No subjective fevers/chills.   O:  Vitals:   08/06/16 0630 08/06/16 0730  BP: 126/67 115/66  Pulse: 83 76  Resp: 18 18  Temp: 98.5 F (36.9 C) 99.5 F (37.5 C)    Gen: NAD, A&O Pulm: NWOB Abd: soft, appropriately ttp, fundus firm and below Umb Ext: No evidence of DVT, trace edema b/l  Labs CBC    Component Value Date/Time   WBC 27.4 (H) 08/06/2016 0537   RBC 4.11 08/06/2016 0537   HGB 13.5 08/06/2016 0537   HCT 37.5 08/06/2016 0537   PLT 168 08/06/2016 0537   MCV 91.2 08/06/2016 0537   MCH 32.8 08/06/2016 0537   MCHC 36.0 08/06/2016 0537   RDW 12.4 08/06/2016 0537   LYMPHSABS 1.1 02/05/2011 0944   MONOABS 0.5 02/05/2011 0944   EOSABS 0.0 02/05/2011 0944   BASOSABS 0.0 02/05/2011 0944      A/P:  PPD#0 s/p precipitous SVD, doing well pp. AFVSS. Benign exam. No sig med/surg issues. Continue present care. Plan for d/c PPD#2 and circ on PPD#1. Was GBS + but no PCN in labor given came in c/c/+2 and delivered precipitously.  Belva AgeeElise Leger MD

## 2016-08-07 LAB — CBC
HCT: 32.4 % — ABNORMAL LOW (ref 36.0–46.0)
Hemoglobin: 11.5 g/dL — ABNORMAL LOW (ref 12.0–15.0)
MCH: 33 pg (ref 26.0–34.0)
MCHC: 35.5 g/dL (ref 30.0–36.0)
MCV: 92.8 fL (ref 78.0–100.0)
PLATELETS: 146 10*3/uL — AB (ref 150–400)
RBC: 3.49 MIL/uL — ABNORMAL LOW (ref 3.87–5.11)
RDW: 12.8 % (ref 11.5–15.5)
WBC: 14.2 10*3/uL — AB (ref 4.0–10.5)

## 2016-08-07 MED ORDER — IBUPROFEN 600 MG PO TABS: 600.0000 mg | ORAL_TABLET | Freq: Four times a day (QID) | ORAL | 0 refills | Status: AC

## 2016-08-07 MED ORDER — RHO D IMMUNE GLOBULIN 1500 UNIT/2ML IJ SOSY
300.0000 ug | PREFILLED_SYRINGE | Freq: Once | INTRAMUSCULAR | Status: AC
  Administered 2016-08-07: 300 ug via INTRAMUSCULAR
  Filled 2016-08-07: qty 2

## 2016-08-07 NOTE — Progress Notes (Signed)
Post Partum Day 1 Subjective: no complaints, up ad lib and tolerating PO  Objective: Blood pressure (!) 108/57, pulse 61, temperature 97.9 F (36.6 C), resp. rate 18, height 5\' 6"  (1.676 m), weight 192 lb (87.1 kg), SpO2 98 %, unknown if currently breastfeeding.  Physical Exam:  General: alert and cooperative Lochia: appropriate Uterine Fundus: firm Incision: n/a DVT Evaluation: No evidence of DVT seen on physical exam.   Recent Labs  08/06/16 0537 08/07/16 0520  HGB 13.5 11.5*  HCT 37.5 32.4*    Assessment/Plan: Plan for discharge tomorrow and Circumcision prior to discharge  R/b/a of circumcision d/w pt - questions answered, informed consent   LOS: 1 day   Ezzard Ditmer 08/07/2016, 8:40 AM

## 2016-08-07 NOTE — Lactation Note (Signed)
This note was copied from a baby's chart. Lactation Consultation Note  Baby has not bf since this morning.  Was circumcised today. Undressed baby for feeding and mother hand expressed drops of colostrum which was given to baby on a spoon. Assisted w/ latching in football hold.  Sucks and swallows observed. Mother has blister on tip of L nipple and both nipples are tender. Demonstrated how to compress breast to achieve a deeper latch.   Reviewed engorgement care and monitoring voids/stools/ Mom encouraged to feed baby 8-12 times/24 hours and with feeding cues.  Alvino Chapelllen RN will give mother coconut oil for sore nipples.  Patient Name: Amy Brock ZOXWR'UToday's Date: 08/07/2016 Reason for consult: Follow-up assessment   Maternal Data    Feeding Feeding Type: Breast Fed  LATCH Score/Interventions Latch: Grasps breast easily, tongue down, lips flanged, rhythmical sucking. Intervention(s): Skin to skin;Waking techniques Intervention(s): Breast massage;Breast compression  Audible Swallowing: A few with stimulation Intervention(s): Skin to skin;Hand expression Intervention(s): Alternate breast massage  Type of Nipple: Everted at rest and after stimulation  Comfort (Breast/Nipple): Filling, red/small blisters or bruises, mild/mod discomfort  Problem noted: Mild/Moderate discomfort Interventions (Mild/moderate discomfort): Hand massage  Hold (Positioning): No assistance needed to correctly position infant at breast.  LATCH Score: 8  Lactation Tools Discussed/Used     Consult Status Consult Status: Complete    Hardie PulleyBerkelhammer, Dequincy Born Boschen 08/07/2016, 3:23 PM

## 2016-08-07 NOTE — Discharge Summary (Signed)
Obstetric Discharge Summary Reason for Admission: onset of labor Prenatal Procedures: ultrasound Intrapartum Procedures: spontaneous vaginal delivery Postpartum Procedures: none Complications-Operative and Postpartum: none Hemoglobin  Date Value Ref Range Status  08/07/2016 11.5 (L) 12.0 - 15.0 g/dL Final   HCT  Date Value Ref Range Status  08/07/2016 32.4 (L) 36.0 - 46.0 % Final    Physical Exam:  General: alert and cooperative Lochia: appropriate Uterine Fundus: firm Incision: healing well DVT Evaluation: No evidence of DVT seen on physical exam.  Discharge Diagnoses: Term Pregnancy-delivered  Discharge Information: Date: 08/07/2016 Activity: pelvic rest Diet: routine Medications: PNV and Ibuprofen Condition: stable Instructions: refer to practice specific booklet Discharge to: home   Newborn Data: Live born female  Birth Weight: 7 lb 11.3 oz (3495 g) APGAR: 7, 8  Home with mother.  Amy Brock 08/07/2016, 1:55 PM

## 2016-08-08 LAB — RH IG WORKUP (INCLUDES ABO/RH)
ABO/RH(D): O NEG
FETAL SCREEN: NEGATIVE
GESTATIONAL AGE(WKS): 40
Unit division: 0

## 2016-08-10 LAB — TYPE AND SCREEN
BLOOD PRODUCT EXPIRATION DATE: 201801272359
Blood Product Expiration Date: 201801172359
Unit Type and Rh: 9500
Unit Type and Rh: 9500

## 2016-09-07 ENCOUNTER — Ambulatory Visit: Payer: Self-pay

## 2016-09-07 NOTE — Lactation Note (Signed)
This note was copied from a baby's chart. Lactation Consult for Amy Brock (DOB: [birth year]) and mother, Amy Brock  Mother's reason for visit: won't stay latched Consult:  Initial Lactation Consultant:  Remigio Eisenmengerichey, Adelita Hone [place]  ________________________________________________________________________ BW: 7# 11.3oz D/c wt: 7# 8.1oz Wt on 09-06-16: 10# 1 oz Today's weight: 10# 2.1oz ________________________________________________________________  Mother's Name: [name] Type of delivery:  Vaginal, Spontaneous Delivery Breastfeeding Experience: nursed 1st child for 15 months; this infant will have tongue revision on Monday Maternal Medical Conditions:  Thyroid Maternal Medications: None  ________________________________________________________________________  Breastfeeding History (Post Discharge)  Frequency of breastfeeding: q2-3h (8-10 feedings/day) Duration of feeding: 5 min. Infant only needs 1 breast per feeding  Pumping  Type of pump:  Medela pump in style Frequency:2-3 times/week Volume:  120-13650mL off 1 breast in 5 minutes. She only pumps R side.   Infant Intake and Output Assessment  Voids: 8-10 in 24 hrs.  Color:  Clear yellow Stools: 5 in 24 hrs.  Color:  Yellow  ________________________________________________________________________  Maternal Breast Assessment  Breast:  Full Nipple:  Erect  _______________________________________________________________________ Feeding Assessment/Evaluation  Initial feeding assessment:  Infant's oral assessment:  Variance. Frenulum palpable; visible w/certain tongue movements. Slight heart-shaping of tongue noted w/lateralization. See below for comment on upper lip.   Attached assessment:  Deep. Shallow, initially, but then it became deeper  Lips flanged:  Yes.  Bottom, yes. Top lip needs some help.   Suck assessment:  Nutritive  Pre-feed weight: 4594 g  Post-feed weight: 4654 g  Amount transferred: 60  ml 8 min, R breast  Pre-feed weight:  4690 g  Post-feed weight:  4730g  Amount transferred: 40 ml 5 min, left breast  Total amount transferred: 100 ml  Mother had called w/concerns how infant was latching, saying she wanted help to get through until this Monday (09/10/16), when Dr. Lexine BatonHisaw was going to do a labial and tongue frenotomy. Mother had had a difficult night and a difficult morning feeding Amy Brock. Per Mom, he would refuse the breast, cry, not maintain latch, act like he was going to choke, etc. During that time, he also refused a bottle.   During consultation, Amy Brock latched w/relative ease. He transferred 60 mL from the R breast in 8 minutes & then later took another 40 mL from the L breast in 5 minutes. He did need his mandible lowered to widen gape, which he allowed. His lower lip tended to flange, but his upper lip needed some assistance (appearance of a labial frenum noted w/cobblestoning on upper lip). Mom has more difficulty with her R breast, as that side makes more & likely has a corresponding faster flow. When [facility] latch, the R nipple was noted to be pinched w/blanching at the tip. Mom does feel pinching when nursing from the R breast, and some stinging throughout the day. Mother does not have these difficulties with her L breast.   Although not observed during this consult, Mom reported intermittent nasal congestion, which tends to correspond with the feeding difficulties mentioned above. Of note, Tyler's father had nasal turbinate surgery.   My observation was that infant fed well & that any variances in behavior observed (e.g., certain sounds, limiting gape, etc) were normal for an infant who is quickly transferring milk at the breast. It is possible that the frenotomy will decrease the likelihood of these and the behaviors mother observed this morning.    Plan 1. Attempt laid-back nursing. Can also superficially suppress milk flow with hand pressure (  Mom was shown how  to do this).  2. If this does not help, she can hand-express/pump through the 1st let-down before latching Amy Glassman.  3. Hand-out on paced bottle-feeding given, with emphasis on playing tug-of-war during bottle-feeding.  4. Call/return as needed.    Glenetta Hew, RN, IBCLC

## 2017-01-08 ENCOUNTER — Ambulatory Visit (HOSPITAL_COMMUNITY): Admission: RE | Admit: 2017-01-08 | Payer: BLUE CROSS/BLUE SHIELD | Source: Ambulatory Visit

## 2017-04-18 DIAGNOSIS — L245 Irritant contact dermatitis due to other chemical products: Secondary | ICD-10-CM | POA: Diagnosis not present

## 2017-04-18 DIAGNOSIS — B070 Plantar wart: Secondary | ICD-10-CM | POA: Diagnosis not present

## 2017-05-08 DIAGNOSIS — H524 Presbyopia: Secondary | ICD-10-CM | POA: Diagnosis not present

## 2017-06-26 DIAGNOSIS — L249 Irritant contact dermatitis, unspecified cause: Secondary | ICD-10-CM | POA: Diagnosis not present

## 2017-06-26 DIAGNOSIS — B070 Plantar wart: Secondary | ICD-10-CM | POA: Diagnosis not present

## 2017-10-01 DIAGNOSIS — Z319 Encounter for procreative management, unspecified: Secondary | ICD-10-CM | POA: Diagnosis not present

## 2018-02-21 DIAGNOSIS — Z6825 Body mass index (BMI) 25.0-25.9, adult: Secondary | ICD-10-CM | POA: Diagnosis not present

## 2018-02-21 DIAGNOSIS — Z01419 Encounter for gynecological examination (general) (routine) without abnormal findings: Secondary | ICD-10-CM | POA: Diagnosis not present

## 2018-06-04 ENCOUNTER — Other Ambulatory Visit: Payer: Self-pay | Admitting: Internal Medicine

## 2018-06-04 DIAGNOSIS — E041 Nontoxic single thyroid nodule: Secondary | ICD-10-CM | POA: Diagnosis not present

## 2018-06-04 DIAGNOSIS — E063 Autoimmune thyroiditis: Secondary | ICD-10-CM

## 2018-06-04 DIAGNOSIS — Z23 Encounter for immunization: Secondary | ICD-10-CM | POA: Diagnosis not present

## 2018-06-09 ENCOUNTER — Ambulatory Visit
Admission: RE | Admit: 2018-06-09 | Discharge: 2018-06-09 | Disposition: A | Discharge status: Home / Self Care | Payer: BLUE CROSS/BLUE SHIELD | Source: Ambulatory Visit | Attending: Internal Medicine | Admitting: Internal Medicine

## 2018-06-09 DIAGNOSIS — E041 Nontoxic single thyroid nodule: Secondary | ICD-10-CM

## 2018-06-09 DIAGNOSIS — E063 Autoimmune thyroiditis: Secondary | ICD-10-CM

## 2018-11-11 DIAGNOSIS — Z319 Encounter for procreative management, unspecified: Secondary | ICD-10-CM | POA: Diagnosis not present

## 2019-03-03 DIAGNOSIS — Z20828 Contact with and (suspected) exposure to other viral communicable diseases: Secondary | ICD-10-CM | POA: Diagnosis not present

## 2019-03-19 DIAGNOSIS — Z3169 Encounter for other general counseling and advice on procreation: Secondary | ICD-10-CM | POA: Diagnosis not present

## 2019-03-26 DIAGNOSIS — Z6826 Body mass index (BMI) 26.0-26.9, adult: Secondary | ICD-10-CM | POA: Diagnosis not present

## 2019-03-26 DIAGNOSIS — Z01419 Encounter for gynecological examination (general) (routine) without abnormal findings: Secondary | ICD-10-CM | POA: Diagnosis not present

## 2019-04-07 ENCOUNTER — Other Ambulatory Visit: Payer: Self-pay

## 2019-04-07 DIAGNOSIS — Z20822 Contact with and (suspected) exposure to covid-19: Secondary | ICD-10-CM

## 2019-04-08 LAB — NOVEL CORONAVIRUS, NAA: SARS-CoV-2, NAA: NOT DETECTED

## 2019-04-13 DIAGNOSIS — Z3141 Encounter for fertility testing: Secondary | ICD-10-CM | POA: Diagnosis not present

## 2019-04-15 DIAGNOSIS — Z3169 Encounter for other general counseling and advice on procreation: Secondary | ICD-10-CM | POA: Diagnosis not present

## 2019-04-30 DIAGNOSIS — Z3141 Encounter for fertility testing: Secondary | ICD-10-CM | POA: Diagnosis not present

## 2019-05-14 ENCOUNTER — Other Ambulatory Visit: Payer: Self-pay | Admitting: Emergency Medicine

## 2019-05-14 DIAGNOSIS — Z20822 Contact with and (suspected) exposure to covid-19: Secondary | ICD-10-CM

## 2019-05-15 DIAGNOSIS — Z3141 Encounter for fertility testing: Secondary | ICD-10-CM | POA: Diagnosis not present

## 2019-05-15 LAB — NOVEL CORONAVIRUS, NAA: SARS-CoV-2, NAA: NOT DETECTED

## 2019-06-01 DIAGNOSIS — Z3200 Encounter for pregnancy test, result unknown: Secondary | ICD-10-CM | POA: Diagnosis not present

## 2019-06-03 DIAGNOSIS — Z3200 Encounter for pregnancy test, result unknown: Secondary | ICD-10-CM | POA: Diagnosis not present

## 2019-06-04 DIAGNOSIS — Z3200 Encounter for pregnancy test, result unknown: Secondary | ICD-10-CM | POA: Diagnosis not present

## 2019-06-09 DIAGNOSIS — E041 Nontoxic single thyroid nodule: Secondary | ICD-10-CM | POA: Diagnosis not present

## 2019-06-09 DIAGNOSIS — O99281 Endocrine, nutritional and metabolic diseases complicating pregnancy, first trimester: Secondary | ICD-10-CM | POA: Diagnosis not present

## 2019-06-09 DIAGNOSIS — E063 Autoimmune thyroiditis: Secondary | ICD-10-CM | POA: Diagnosis not present

## 2019-06-23 DIAGNOSIS — O0901 Supervision of pregnancy with history of infertility, first trimester: Secondary | ICD-10-CM | POA: Diagnosis not present

## 2019-06-23 DIAGNOSIS — Z3A00 Weeks of gestation of pregnancy not specified: Secondary | ICD-10-CM | POA: Diagnosis not present

## 2019-06-26 DIAGNOSIS — H17821 Peripheral opacity of cornea, right eye: Secondary | ICD-10-CM | POA: Diagnosis not present

## 2019-06-26 DIAGNOSIS — Z83511 Family history of glaucoma: Secondary | ICD-10-CM | POA: Diagnosis not present

## 2019-06-26 DIAGNOSIS — H40013 Open angle with borderline findings, low risk, bilateral: Secondary | ICD-10-CM | POA: Diagnosis not present

## 2019-07-01 DIAGNOSIS — Z3481 Encounter for supervision of other normal pregnancy, first trimester: Secondary | ICD-10-CM | POA: Diagnosis not present

## 2019-07-01 DIAGNOSIS — Z3685 Encounter for antenatal screening for Streptococcus B: Secondary | ICD-10-CM | POA: Diagnosis not present

## 2019-07-01 DIAGNOSIS — Z3480 Encounter for supervision of other normal pregnancy, unspecified trimester: Secondary | ICD-10-CM | POA: Diagnosis not present

## 2019-07-01 DIAGNOSIS — Z23 Encounter for immunization: Secondary | ICD-10-CM | POA: Diagnosis not present

## 2019-07-01 LAB — OB RESULTS CONSOLE ABO/RH: RH Type: NEGATIVE

## 2019-07-01 LAB — OB RESULTS CONSOLE HIV ANTIBODY (ROUTINE TESTING): HIV: NONREACTIVE

## 2019-07-01 LAB — OB RESULTS CONSOLE HEPATITIS B SURFACE ANTIGEN: Hepatitis B Surface Ag: NEGATIVE

## 2019-07-01 LAB — OB RESULTS CONSOLE ANTIBODY SCREEN: Antibody Screen: NEGATIVE

## 2019-07-01 LAB — OB RESULTS CONSOLE RPR: RPR: NONREACTIVE

## 2019-07-01 LAB — OB RESULTS CONSOLE GC/CHLAMYDIA
Chlamydia: NEGATIVE
Gonorrhea: NEGATIVE

## 2019-07-01 LAB — OB RESULTS CONSOLE RUBELLA ANTIBODY, IGM: Rubella: IMMUNE

## 2019-07-02 DIAGNOSIS — Z20828 Contact with and (suspected) exposure to other viral communicable diseases: Secondary | ICD-10-CM | POA: Diagnosis not present

## 2019-07-02 DIAGNOSIS — Z03818 Encounter for observation for suspected exposure to other biological agents ruled out: Secondary | ICD-10-CM | POA: Diagnosis not present

## 2019-07-13 DIAGNOSIS — Z3400 Encounter for supervision of normal first pregnancy, unspecified trimester: Secondary | ICD-10-CM | POA: Diagnosis not present

## 2019-07-13 DIAGNOSIS — Z113 Encounter for screening for infections with a predominantly sexual mode of transmission: Secondary | ICD-10-CM | POA: Diagnosis not present

## 2019-07-13 DIAGNOSIS — Z3480 Encounter for supervision of other normal pregnancy, unspecified trimester: Secondary | ICD-10-CM | POA: Diagnosis not present

## 2019-07-15 DIAGNOSIS — Z3200 Encounter for pregnancy test, result unknown: Secondary | ICD-10-CM | POA: Diagnosis not present

## 2019-07-15 DIAGNOSIS — Z20818 Contact with and (suspected) exposure to other bacterial communicable diseases: Secondary | ICD-10-CM | POA: Diagnosis not present

## 2019-07-22 DIAGNOSIS — E041 Nontoxic single thyroid nodule: Secondary | ICD-10-CM | POA: Diagnosis not present

## 2019-07-22 DIAGNOSIS — E063 Autoimmune thyroiditis: Secondary | ICD-10-CM | POA: Diagnosis not present

## 2019-07-28 DIAGNOSIS — R871 Abnormal level of hormones in specimens from female genital organs: Secondary | ICD-10-CM | POA: Diagnosis not present

## 2019-07-28 DIAGNOSIS — Z3682 Encounter for antenatal screening for nuchal translucency: Secondary | ICD-10-CM | POA: Diagnosis not present

## 2019-07-28 DIAGNOSIS — Z3A12 12 weeks gestation of pregnancy: Secondary | ICD-10-CM | POA: Diagnosis not present

## 2019-07-28 DIAGNOSIS — O09521 Supervision of elderly multigravida, first trimester: Secondary | ICD-10-CM | POA: Diagnosis not present

## 2019-08-05 DIAGNOSIS — E063 Autoimmune thyroiditis: Secondary | ICD-10-CM | POA: Diagnosis not present

## 2019-08-14 NOTE — L&D Delivery Note (Signed)
Operative Delivery Note At 10:31 AM a viable female was delivered via Vaginal, Vacuum Investment banker, operational).  Presentation: vertex; Position: Right,, Occiput,, Anterior; Station: +2.  Verbal consent: obtained from patient.  Risks and benefits discussed in detail.  Risks include, but are not limited to the risks of anesthesia, bleeding, infection, damage to maternal tissues, fetal cephalhematoma.  There is also the risk of inability to effect vaginal delivery of the head, or shoulder dystocia that cannot be resolved by established maneuvers, leading to the need for emergency cesarean section.  APGAR: 8 9  weight pending .   Placenta status:spontaneously with 3 vessel cord normal , .   Cord:  with the following complications: none.  Cord pH: not obtained  Anesthesia:  epidural Instruments: mushroom  Episiotomy: Median Lacerations: 2nd degree;Perineal Suture Repair: 2.0 3.0 chromic vicryl Est. Blood Loss (mL):  300  Mom to postpartum.  Baby to Couplet care / Skin to Skin.  Jeani Hawking 02/12/2020, 10:46 AM

## 2019-08-17 ENCOUNTER — Other Ambulatory Visit: Payer: BC Managed Care – PPO

## 2019-08-19 ENCOUNTER — Other Ambulatory Visit: Payer: Self-pay

## 2019-08-19 DIAGNOSIS — N760 Acute vaginitis: Secondary | ICD-10-CM | POA: Diagnosis not present

## 2019-08-19 DIAGNOSIS — Z20822 Contact with and (suspected) exposure to covid-19: Secondary | ICD-10-CM

## 2019-08-19 DIAGNOSIS — O360910 Maternal care for other rhesus isoimmunization, first trimester, not applicable or unspecified: Secondary | ICD-10-CM | POA: Diagnosis not present

## 2019-08-19 DIAGNOSIS — Z3A15 15 weeks gestation of pregnancy: Secondary | ICD-10-CM | POA: Diagnosis not present

## 2019-08-20 DIAGNOSIS — E063 Autoimmune thyroiditis: Secondary | ICD-10-CM | POA: Diagnosis not present

## 2019-08-20 LAB — NOVEL CORONAVIRUS, NAA: SARS-CoV-2, NAA: NOT DETECTED

## 2019-09-06 DIAGNOSIS — Z23 Encounter for immunization: Secondary | ICD-10-CM | POA: Diagnosis not present

## 2019-09-07 DIAGNOSIS — E041 Nontoxic single thyroid nodule: Secondary | ICD-10-CM | POA: Diagnosis not present

## 2019-09-07 DIAGNOSIS — E063 Autoimmune thyroiditis: Secondary | ICD-10-CM | POA: Diagnosis not present

## 2019-09-07 DIAGNOSIS — O99282 Endocrine, nutritional and metabolic diseases complicating pregnancy, second trimester: Secondary | ICD-10-CM | POA: Diagnosis not present

## 2019-09-08 DIAGNOSIS — Z3A18 18 weeks gestation of pregnancy: Secondary | ICD-10-CM | POA: Diagnosis not present

## 2019-09-08 DIAGNOSIS — O09522 Supervision of elderly multigravida, second trimester: Secondary | ICD-10-CM | POA: Diagnosis not present

## 2019-09-08 DIAGNOSIS — Z363 Encounter for antenatal screening for malformations: Secondary | ICD-10-CM | POA: Diagnosis not present

## 2019-09-08 DIAGNOSIS — N760 Acute vaginitis: Secondary | ICD-10-CM | POA: Diagnosis not present

## 2019-09-14 DIAGNOSIS — O99282 Endocrine, nutritional and metabolic diseases complicating pregnancy, second trimester: Secondary | ICD-10-CM | POA: Diagnosis not present

## 2019-09-14 DIAGNOSIS — E063 Autoimmune thyroiditis: Secondary | ICD-10-CM | POA: Diagnosis not present

## 2019-09-27 DIAGNOSIS — Z23 Encounter for immunization: Secondary | ICD-10-CM | POA: Diagnosis not present

## 2019-09-28 DIAGNOSIS — N760 Acute vaginitis: Secondary | ICD-10-CM | POA: Diagnosis not present

## 2019-10-12 DIAGNOSIS — E063 Autoimmune thyroiditis: Secondary | ICD-10-CM | POA: Diagnosis not present

## 2019-10-12 DIAGNOSIS — O99282 Endocrine, nutritional and metabolic diseases complicating pregnancy, second trimester: Secondary | ICD-10-CM | POA: Diagnosis not present

## 2019-10-15 DIAGNOSIS — N760 Acute vaginitis: Secondary | ICD-10-CM | POA: Diagnosis not present

## 2019-10-26 DIAGNOSIS — N760 Acute vaginitis: Secondary | ICD-10-CM | POA: Diagnosis not present

## 2019-11-04 DIAGNOSIS — Z319 Encounter for procreative management, unspecified: Secondary | ICD-10-CM | POA: Diagnosis not present

## 2019-11-06 DIAGNOSIS — M25512 Pain in left shoulder: Secondary | ICD-10-CM | POA: Diagnosis not present

## 2019-11-09 DIAGNOSIS — E063 Autoimmune thyroiditis: Secondary | ICD-10-CM | POA: Diagnosis not present

## 2019-11-11 DIAGNOSIS — Z3480 Encounter for supervision of other normal pregnancy, unspecified trimester: Secondary | ICD-10-CM | POA: Diagnosis not present

## 2019-11-11 DIAGNOSIS — N760 Acute vaginitis: Secondary | ICD-10-CM | POA: Diagnosis not present

## 2019-11-11 DIAGNOSIS — Z3A27 27 weeks gestation of pregnancy: Secondary | ICD-10-CM | POA: Diagnosis not present

## 2019-11-11 DIAGNOSIS — Z23 Encounter for immunization: Secondary | ICD-10-CM | POA: Diagnosis not present

## 2019-11-11 DIAGNOSIS — O360920 Maternal care for other rhesus isoimmunization, second trimester, not applicable or unspecified: Secondary | ICD-10-CM | POA: Diagnosis not present

## 2019-11-11 LAB — OB RESULTS CONSOLE HIV ANTIBODY (ROUTINE TESTING): HIV: NONREACTIVE

## 2020-01-05 LAB — OB RESULTS CONSOLE GBS: GBS: POSITIVE

## 2020-02-01 ENCOUNTER — Encounter (HOSPITAL_COMMUNITY): Payer: Self-pay | Admitting: *Deleted

## 2020-02-01 ENCOUNTER — Inpatient Hospital Stay (HOSPITAL_COMMUNITY): Payer: BC Managed Care – PPO

## 2020-02-01 ENCOUNTER — Telehealth (HOSPITAL_COMMUNITY): Payer: Self-pay | Admitting: *Deleted

## 2020-02-01 NOTE — Telephone Encounter (Signed)
Preadmission screen  

## 2020-02-03 ENCOUNTER — Telehealth (HOSPITAL_COMMUNITY): Payer: Self-pay | Admitting: *Deleted

## 2020-02-03 NOTE — Telephone Encounter (Signed)
Preadmission screen  

## 2020-02-06 ENCOUNTER — Other Ambulatory Visit (HOSPITAL_COMMUNITY): Payer: BC Managed Care – PPO

## 2020-02-08 ENCOUNTER — Inpatient Hospital Stay (HOSPITAL_COMMUNITY): Payer: BC Managed Care – PPO

## 2020-02-10 ENCOUNTER — Other Ambulatory Visit (HOSPITAL_COMMUNITY)
Admission: RE | Admit: 2020-02-10 | Discharge: 2020-02-10 | Disposition: A | Discharge status: Home / Self Care | Payer: BC Managed Care – PPO | Source: Ambulatory Visit | Attending: Obstetrics and Gynecology | Admitting: Obstetrics and Gynecology

## 2020-02-10 LAB — SARS CORONAVIRUS 2 (TAT 6-24 HRS): SARS Coronavirus 2: NEGATIVE

## 2020-02-12 ENCOUNTER — Inpatient Hospital Stay (HOSPITAL_COMMUNITY): Payer: BC Managed Care – PPO | Admitting: Anesthesiology

## 2020-02-12 ENCOUNTER — Encounter (HOSPITAL_COMMUNITY): Payer: Self-pay | Admitting: Obstetrics and Gynecology

## 2020-02-12 ENCOUNTER — Other Ambulatory Visit: Payer: Self-pay

## 2020-02-12 ENCOUNTER — Inpatient Hospital Stay (HOSPITAL_COMMUNITY): Payer: BC Managed Care – PPO

## 2020-02-12 ENCOUNTER — Inpatient Hospital Stay (HOSPITAL_COMMUNITY)
Admission: AD | Admit: 2020-02-12 | Discharge: 2020-02-14 | DRG: 807 | Disposition: A | Discharge status: Home / Self Care | Payer: BC Managed Care – PPO | Attending: Obstetrics and Gynecology | Admitting: Obstetrics and Gynecology

## 2020-02-12 DIAGNOSIS — E669 Obesity, unspecified: Secondary | ICD-10-CM | POA: Diagnosis present

## 2020-02-12 DIAGNOSIS — O99824 Streptococcus B carrier state complicating childbirth: Secondary | ICD-10-CM | POA: Diagnosis present

## 2020-02-12 DIAGNOSIS — Z3490 Encounter for supervision of normal pregnancy, unspecified, unspecified trimester: Secondary | ICD-10-CM

## 2020-02-12 DIAGNOSIS — O99214 Obesity complicating childbirth: Secondary | ICD-10-CM | POA: Diagnosis present

## 2020-02-12 DIAGNOSIS — Z20822 Contact with and (suspected) exposure to covid-19: Secondary | ICD-10-CM | POA: Diagnosis present

## 2020-02-12 DIAGNOSIS — Z3A40 40 weeks gestation of pregnancy: Secondary | ICD-10-CM | POA: Diagnosis not present

## 2020-02-12 DIAGNOSIS — O480 Post-term pregnancy: Principal | ICD-10-CM | POA: Diagnosis present

## 2020-02-12 LAB — CBC
HCT: 37.2 % (ref 36.0–46.0)
Hemoglobin: 12.7 g/dL (ref 12.0–15.0)
MCH: 32.4 pg (ref 26.0–34.0)
MCHC: 34.1 g/dL (ref 30.0–36.0)
MCV: 94.9 fL (ref 80.0–100.0)
Platelets: 151 10*3/uL (ref 150–400)
RBC: 3.92 MIL/uL (ref 3.87–5.11)
RDW: 12.6 % (ref 11.5–15.5)
WBC: 12.7 10*3/uL — ABNORMAL HIGH (ref 4.0–10.5)
nRBC: 0 % (ref 0.0–0.2)

## 2020-02-12 LAB — RPR: RPR Ser Ql: NONREACTIVE

## 2020-02-12 LAB — TYPE AND SCREEN
ABO/RH(D): O NEG
Antibody Screen: NEGATIVE

## 2020-02-12 LAB — ABO/RH: ABO/RH(D): O NEG

## 2020-02-12 MED ORDER — TETANUS-DIPHTH-ACELL PERTUSSIS 5-2.5-18.5 LF-MCG/0.5 IM SUSP: 0.5000 mL | Freq: Once | INTRAMUSCULAR | Status: DC

## 2020-02-12 MED ORDER — SODIUM CHLORIDE 0.9 % IV SOLN
5.0000 10*6.[IU] | Freq: Once | INTRAVENOUS | Status: AC
  Administered 2020-02-12: 5 10*6.[IU] via INTRAVENOUS
  Filled 2020-02-12: qty 5

## 2020-02-12 MED ORDER — ACETAMINOPHEN 325 MG PO TABS
650.0000 mg | ORAL_TABLET | ORAL | Status: DC | PRN
  Administered 2020-02-13: 650 mg via ORAL
  Filled 2020-02-12: qty 2

## 2020-02-12 MED ORDER — LIDOCAINE-EPINEPHRINE (PF) 2 %-1:200000 IJ SOLN
INTRAMUSCULAR | Status: DC | PRN
  Administered 2020-02-12 (×2): 5 mL via EPIDURAL

## 2020-02-12 MED ORDER — PENICILLIN G POT IN DEXTROSE 60000 UNIT/ML IV SOLN
3.0000 10*6.[IU] | INTRAVENOUS | Status: DC
  Administered 2020-02-12: 3 10*6.[IU] via INTRAVENOUS
  Filled 2020-02-12 (×2): qty 50

## 2020-02-12 MED ORDER — EPHEDRINE 5 MG/ML INJ: 10.0000 mg | INTRAVENOUS | Status: DC | PRN

## 2020-02-12 MED ORDER — PRENATAL MULTIVITAMIN CH
1.0000 | ORAL_TABLET | Freq: Every day | ORAL | Status: DC
  Administered 2020-02-13: 1 via ORAL
  Filled 2020-02-12: qty 1

## 2020-02-12 MED ORDER — SODIUM CHLORIDE (PF) 0.9 % IJ SOLN
INTRAMUSCULAR | Status: DC | PRN
  Administered 2020-02-12: 12 mL/h via EPIDURAL

## 2020-02-12 MED ORDER — OXYCODONE-ACETAMINOPHEN 5-325 MG PO TABS: 1.0000 | ORAL_TABLET | ORAL | Status: DC | PRN

## 2020-02-12 MED ORDER — MEASLES, MUMPS & RUBELLA VAC IJ SOLR: 0.5000 mL | Freq: Once | INTRAMUSCULAR | Status: DC

## 2020-02-12 MED ORDER — FLEET ENEMA 7-19 GM/118ML RE ENEM: 1.0000 | ENEMA | Freq: Every day | RECTAL | Status: DC | PRN

## 2020-02-12 MED ORDER — OXYTOCIN BOLUS FROM INFUSION
333.0000 mL | Freq: Once | INTRAVENOUS | Status: AC
  Administered 2020-02-12: 333 mL via INTRAVENOUS

## 2020-02-12 MED ORDER — BENZOCAINE-MENTHOL 20-0.5 % EX AERO
1.0000 "application " | INHALATION_SPRAY | CUTANEOUS | Status: DC | PRN
  Administered 2020-02-12 – 2020-02-14 (×2): 1 via TOPICAL
  Filled 2020-02-12 (×2): qty 56

## 2020-02-12 MED ORDER — OXYTOCIN-SODIUM CHLORIDE 30-0.9 UT/500ML-% IV SOLN: 2.5000 [IU]/h | INTRAVENOUS | Status: DC

## 2020-02-12 MED ORDER — DIBUCAINE (PERIANAL) 1 % EX OINT: 1.0000 "application " | TOPICAL_OINTMENT | CUTANEOUS | Status: DC | PRN

## 2020-02-12 MED ORDER — OXYTOCIN-SODIUM CHLORIDE 30-0.9 UT/500ML-% IV SOLN
1.0000 m[IU]/min | INTRAVENOUS | Status: DC
  Filled 2020-02-12 (×2): qty 500

## 2020-02-12 MED ORDER — ZOLPIDEM TARTRATE 5 MG PO TABS: 5.0000 mg | ORAL_TABLET | Freq: Every evening | ORAL | Status: DC | PRN

## 2020-02-12 MED ORDER — BISACODYL 10 MG RE SUPP: 10.0000 mg | Freq: Every day | RECTAL | Status: DC | PRN

## 2020-02-12 MED ORDER — OXYCODONE-ACETAMINOPHEN 5-325 MG PO TABS: 2.0000 | ORAL_TABLET | ORAL | Status: DC | PRN

## 2020-02-12 MED ORDER — PHENYLEPHRINE 40 MCG/ML (10ML) SYRINGE FOR IV PUSH (FOR BLOOD PRESSURE SUPPORT): 80.0000 ug | PREFILLED_SYRINGE | INTRAVENOUS | Status: DC | PRN

## 2020-02-12 MED ORDER — LACTATED RINGERS IV SOLN: INTRAVENOUS | Status: DC

## 2020-02-12 MED ORDER — DIPHENHYDRAMINE HCL 50 MG/ML IJ SOLN: 12.5000 mg | INTRAMUSCULAR | Status: DC | PRN

## 2020-02-12 MED ORDER — MISOPROSTOL 25 MCG QUARTER TABLET
25.0000 ug | ORAL_TABLET | ORAL | Status: DC | PRN
  Administered 2020-02-12 (×2): 25 ug via VAGINAL
  Filled 2020-02-12 (×2): qty 1

## 2020-02-12 MED ORDER — MEDROXYPROGESTERONE ACETATE 150 MG/ML IM SUSP: 150.0000 mg | INTRAMUSCULAR | Status: DC | PRN

## 2020-02-12 MED ORDER — IBUPROFEN 600 MG PO TABS
600.0000 mg | ORAL_TABLET | Freq: Four times a day (QID) | ORAL | Status: DC
  Administered 2020-02-12 – 2020-02-14 (×7): 600 mg via ORAL
  Filled 2020-02-12 (×7): qty 1

## 2020-02-12 MED ORDER — SOD CITRATE-CITRIC ACID 500-334 MG/5ML PO SOLN: 30.0000 mL | ORAL | Status: DC | PRN

## 2020-02-12 MED ORDER — ONDANSETRON HCL 4 MG/2ML IJ SOLN: 4.0000 mg | INTRAMUSCULAR | Status: DC | PRN

## 2020-02-12 MED ORDER — LIDOCAINE HCL (PF) 1 % IJ SOLN
INTRAMUSCULAR | Status: DC | PRN
  Administered 2020-02-12 (×2): 4 mL via EPIDURAL

## 2020-02-12 MED ORDER — SENNOSIDES-DOCUSATE SODIUM 8.6-50 MG PO TABS
2.0000 | ORAL_TABLET | ORAL | Status: DC
  Administered 2020-02-13 (×2): 2 via ORAL
  Filled 2020-02-12 (×2): qty 2

## 2020-02-12 MED ORDER — ONDANSETRON HCL 4 MG PO TABS: 4.0000 mg | ORAL_TABLET | ORAL | Status: DC | PRN

## 2020-02-12 MED ORDER — ONDANSETRON HCL 4 MG/2ML IJ SOLN: 4.0000 mg | Freq: Four times a day (QID) | INTRAMUSCULAR | Status: DC | PRN

## 2020-02-12 MED ORDER — TERBUTALINE SULFATE 1 MG/ML IJ SOLN: 0.2500 mg | Freq: Once | INTRAMUSCULAR | Status: DC | PRN

## 2020-02-12 MED ORDER — LACTATED RINGERS IV SOLN
500.0000 mL | INTRAVENOUS | Status: DC | PRN
  Administered 2020-02-12: 500 mL via INTRAVENOUS

## 2020-02-12 MED ORDER — LACTATED RINGERS IV SOLN: 500.0000 mL | Freq: Once | INTRAVENOUS | Status: DC

## 2020-02-12 MED ORDER — LIDOCAINE HCL (PF) 1 % IJ SOLN: 30.0000 mL | INTRAMUSCULAR | Status: DC | PRN

## 2020-02-12 MED ORDER — WITCH HAZEL-GLYCERIN EX PADS: 1.0000 "application " | MEDICATED_PAD | CUTANEOUS | Status: DC | PRN

## 2020-02-12 MED ORDER — ACETAMINOPHEN 325 MG PO TABS: 650.0000 mg | ORAL_TABLET | ORAL | Status: DC | PRN

## 2020-02-12 MED ORDER — DIPHENHYDRAMINE HCL 25 MG PO CAPS: 25.0000 mg | ORAL_CAPSULE | Freq: Four times a day (QID) | ORAL | Status: DC | PRN

## 2020-02-12 MED ORDER — COCONUT OIL OIL
1.0000 "application " | TOPICAL_OIL | Status: DC | PRN
  Administered 2020-02-13: 1 via TOPICAL

## 2020-02-12 MED ORDER — SIMETHICONE 80 MG PO CHEW: 80.0000 mg | CHEWABLE_TABLET | ORAL | Status: DC | PRN

## 2020-02-12 MED ORDER — FENTANYL-BUPIVACAINE-NACL 0.5-0.125-0.9 MG/250ML-% EP SOLN
12.0000 mL/h | EPIDURAL | Status: DC | PRN
  Filled 2020-02-12: qty 250

## 2020-02-12 NOTE — Anesthesia Preprocedure Evaluation (Signed)
Anesthesia Evaluation  Patient identified by MRN, date of birth, ID band Patient awake    Reviewed: Allergy & Precautions, NPO status , Patient's Chart, lab work & pertinent test results  History of Anesthesia Complications Negative for: history of anesthetic complications  Airway Mallampati: II  TM Distance: >3 FB Neck ROM: Full    Dental no notable dental hx. (+) Teeth Intact   Pulmonary neg pulmonary ROS,    Pulmonary exam normal breath sounds clear to auscultation       Cardiovascular hypertension, Normal cardiovascular exam Rhythm:Regular Rate:Normal     Neuro/Psych negative neurological ROS     GI/Hepatic Neg liver ROS, GERD  ,  Endo/Other  Obesity  Renal/GU negative Renal ROS     Musculoskeletal   Abdominal (+) + obese,   Peds  Hematology  (+) anemia ,   Anesthesia Other Findings   Reproductive/Obstetrics (+) Pregnancy                             Anesthesia Physical  Anesthesia Plan  ASA: II  Anesthesia Plan: Epidural   Post-op Pain Management:    Induction:   PONV Risk Score and Plan:   Airway Management Planned: Natural Airway  Additional Equipment:   Intra-op Plan:   Post-operative Plan:   Informed Consent: I have reviewed the patients History and Physical, chart, labs and discussed the procedure including the risks, benefits and alternatives for the proposed anesthesia with the patient or authorized representative who has indicated his/her understanding and acceptance.       Plan Discussed with: Anesthesiologist  Anesthesia Plan Comments:         Anesthesia Quick Evaluation

## 2020-02-12 NOTE — Anesthesia Postprocedure Evaluation (Signed)
Anesthesia Post Note  Patient: Amy Brock  Procedure(s) Performed: AN AD HOC LABOR EPIDURAL     Patient location during evaluation: Mother Baby Anesthesia Type: Epidural Level of consciousness: awake and alert Pain management: pain level controlled Vital Signs Assessment: post-procedure vital signs reviewed and stable Respiratory status: spontaneous breathing, nonlabored ventilation and respiratory function stable Cardiovascular status: stable Postop Assessment: no headache, no backache and epidural receding Anesthetic complications: no   No complications documented.  Last Vitals:  Vitals:   02/12/20 1339 02/12/20 1440  BP: 117/62 126/74  Pulse: 76 73  Resp: 16 18  Temp: 37.1 C 37.4 C  SpO2: 96% 98%    Last Pain:  Vitals:   02/12/20 1440  TempSrc: Oral  PainSc: 6    Pain Goal:                   [name]

## 2020-02-12 NOTE — H&P (Signed)
Amy Brock is a 34 y.o.G 3 P 2 at 40 w 5 days presents for post dates IOL.  Status post 2 cytotec OB History    Gravida  3   Para  2   Term  2   Preterm      AB      Living  2     SAB      TAB      Ectopic      Multiple  0   Live Births  2          Past Medical History:  Diagnosis Date  . Pregnancy induced hypertension   . THYROID CYST 10/28/2008  . THYROIDITIS, HX OF 2010  . Vaginal Pap smear, abnormal   . VSD    congenital, closed spont   Past Surgical History:  Procedure Laterality Date  . ANTERIOR CRUCIATE LIGAMENT REPAIR  age 28  . TONSILLECTOMY     @ 11/ post op hemorrhage   Family History: family history includes Breast cancer in her maternal aunt; Cancer in her maternal aunt; Heart disease in her maternal grandfather, maternal grandmother, paternal grandfather, and paternal grandmother; Hypertension in her maternal grandfather, maternal grandmother, and paternal grandfather. Social History:  reports that she has never smoked. She has never used smokeless tobacco. She reports current alcohol use. She reports that she does not use drugs.     Maternal Diabetes: No Genetic Screening: Normal Maternal Ultrasounds/Referrals: Normal Fetal Ultrasounds or other Referrals:  None Maternal Substance Abuse:  No Significant Maternal Medications:  None Significant Maternal Lab Results:  Group B Strep positive Other Comments:  None  Review of Systems  All other systems reviewed and are negative.  History Dilation: 4.5 Effacement (%): 80 Station: -2 Exam by:: Dr. Vincente Poli Blood pressure 127/72, pulse 61, temperature 98.6 F (37 C), temperature source Oral, resp. rate 14, height 5\' 6"  (1.676 m), weight 96 kg, unknown if currently breastfeeding. Maternal Exam:  Uterine Assessment: Contraction strength is moderate.  Contraction frequency is regular.   Abdomen: Fetal presentation: vertex     Physical Exam Vitals and nursing note reviewed. Exam  conducted with a chaperone present.  Constitutional:      Appearance: Normal appearance.  Eyes:     Pupils: Pupils are equal, round, and reactive to light.  Cardiovascular:     Rate and Rhythm: Normal rate and regular rhythm.  Pulmonary:     Effort: Pulmonary effort is normal.  Abdominal:     General: Abdomen is flat.  Neurological:     Mental Status: She is alert.     Prenatal labs: ABO, Rh: --/--/O NEG, O NEG Performed at [facility], [address] [place], [place]  [phone]/02 0030) Antibody: NEG (07/02 0030) Rubella: Immune (11/18 0000) RPR: Nonreactive (11/18 0000)  HBsAg: Negative (11/18 0000)  HIV: Non-reactive (03/31 0000)  GBS: Positive/-- (05/25 0000)   Assessment/Plan: IUP at term Post dates IOL Epidural  Pitocin as needed Anticipate NSVD   12-11-2002 02/12/2020, 7:41 AM

## 2020-02-12 NOTE — Anesthesia Procedure Notes (Signed)
Epidural Patient location during procedure: OB Start time: 02/12/2020 8:00 AM End time: 02/12/2020 8:07 AM  Staffing Anesthesiologist: Mal Amabile, MD Performed: anesthesiologist   Preanesthetic Checklist Completed: patient identified, IV checked, site marked, risks and benefits discussed, surgical consent, monitors and equipment checked, pre-op evaluation and timeout performed  Epidural Patient position: sitting Prep: DuraPrep and site prepped and draped Patient monitoring: continuous pulse ox and blood pressure Approach: midline Location: L4-L5 Injection technique: LOR air  Needle:  Needle type: Tuohy  Needle gauge: 17 G Needle length: 9 cm and 9 Needle insertion depth: 5 cm cm Catheter type: closed end flexible Catheter size: 19 Gauge Catheter at skin depth: 10 cm Test dose: negative and Other  Assessment Events: blood not aspirated, injection not painful, no injection resistance, no paresthesia and negative IV test  Additional Notes Patient identified. Risks and benefits discussed including failed block, incomplete  Pain control, post dural puncture headache, nerve damage, paralysis, blood pressure Changes, nausea, vomiting, reactions to medications-both toxic and allergic and post Partum back pain. All questions were answered. Patient expressed understanding and wished to proceed. Sterile technique was used throughout procedure. Epidural site was Dressed with sterile barrier dressing. No paresthesias, signs of intravascular injection Or signs of intrathecal spread were encountered.  Patient was more comfortable after the epidural was dosed. Please see RN's note for documentation of vital signs and FHR which are stable. Reason for block:procedure for pain

## 2020-02-13 LAB — CBC
HCT: 33.9 % — ABNORMAL LOW (ref 36.0–46.0)
Hemoglobin: 11.2 g/dL — ABNORMAL LOW (ref 12.0–15.0)
MCH: 32.4 pg (ref 26.0–34.0)
MCHC: 33 g/dL (ref 30.0–36.0)
MCV: 98 fL (ref 80.0–100.0)
Platelets: 123 10*3/uL — ABNORMAL LOW (ref 150–400)
RBC: 3.46 MIL/uL — ABNORMAL LOW (ref 3.87–5.11)
RDW: 12.8 % (ref 11.5–15.5)
WBC: 12.1 10*3/uL — ABNORMAL HIGH (ref 4.0–10.5)
nRBC: 0 % (ref 0.0–0.2)

## 2020-02-13 LAB — RUBELLA SCREEN: Rubella: 20.8 index (ref >0.99)

## 2020-02-13 MED ORDER — RHO D IMMUNE GLOBULIN 1500 UNIT/2ML IJ SOSY
300.0000 ug | PREFILLED_SYRINGE | Freq: Once | INTRAMUSCULAR | Status: AC
  Administered 2020-02-13: 300 ug via INTRAMUSCULAR
  Filled 2020-02-13: qty 2

## 2020-02-13 NOTE — Lactation Note (Signed)
This note was copied from a baby's chart. Lactation Consultation Note Baby 50 hrs old.  Mom resting, looked at [facility] when came into room. Baby sleeping. Mom's 3rd child. Mom stated BF going well she doesn't have any concerns.  Patient Name: Amy Brock GSUPJ'S Date: 02/13/2020 Reason for consult: Follow-up assessment   Maternal Data    Feeding Feeding Type: Breast Fed  LATCH Score Latch: Grasps breast easily, tongue down, lips flanged, rhythmical sucking.  Audible Swallowing: A few with stimulation  Type of Nipple: Everted at rest and after stimulation  Comfort (Breast/Nipple): Soft / non-tender  Hold (Positioning): No assistance needed to correctly position infant at breast.  LATCH Score: 9  Interventions    Lactation Tools Discussed/Used     Consult Status Consult Status: Complete Date: 02/13/20    Charyl Dancer 02/13/2020, 11:12 PM

## 2020-02-13 NOTE — Plan of Care (Signed)
  Problem: Education: Goal: Knowledge of condition will improve Outcome: Completed/Met   Problem: Activity: Goal: Will verbalize the importance of balancing activity with adequate rest periods Outcome: Completed/Met Goal: Ability to tolerate increased activity will improve Outcome: Completed/Met   Problem: Coping: Goal: Ability to identify and utilize available resources and services will improve Outcome: Completed/Met   Problem: Life Cycle: Goal: Chance of risk for complications during the postpartum period will decrease Outcome: Completed/Met   Problem: Role Relationship: Goal: Ability to demonstrate positive interaction with newborn will improve Outcome: Completed/Met

## 2020-02-13 NOTE — Lactation Note (Addendum)
This note was copied from a baby's chart. Lactation Consultation Note Baby 15 hrs old. Baby has nasal congestion noted. LC changed diaper for stimulation to wake for feeding. Hadn't fed since 2200. Baby had 2 Lg. Voids in diaper.  Mom is experienced BF mom. Her 35 yr old she BF for 15 months, her 35 yr old she BF for 2 yrs. Mom has everted nipples. Attempted to latch in cross cradle position. Mom stated that is how she latched her earlier. Mom had no interest in feeding. Mom concerned about nasal congestion. Suggested football hold. Baby still has no interest in feeding.  Baby has recessed chin. Suggested chin tug. Reviewed newborn behavior, STS, I&O, breast massage, supply and demand. Mom encouraged to feed baby 8-12 times/24 hours and with feeding cues. Mom encouraged to waken baby for feeds.   Swaddled baby placed in bassinet. Encouraged mom to call for questions or concerns. Lactation brochure given.  Patient Name: [name] Date: 02/13/2020 Reason for consult: Initial assessment;Term   Maternal Data Has patient been taught Hand Expression?: Yes Does the patient have breastfeeding experience prior to this delivery?: Yes  Feeding Feeding Type: Breast Fed  LATCH Score Latch: Too sleepy or reluctant, no latch achieved, no sucking elicited.  Audible Swallowing: None  Type of Nipple: Everted at rest and after stimulation  Comfort (Breast/Nipple): Soft / non-tender  Hold (Positioning): Assistance needed to correctly position infant at breast and maintain latch.  LATCH Score: 5  Interventions Interventions: Breast feeding basics reviewed;Support pillows;Assisted with latch;Position options;Skin to skin;Breast massage;Breast compression;Adjust position  Lactation Tools Discussed/Used WIC Program: No   Consult Status Consult Status: Follow-up Date: 02/13/20 Follow-up type: In-patient    Kaliah Haddaway, Diamond Nickel 02/13/2020, 1:31 AM

## 2020-02-13 NOTE — Progress Notes (Signed)
PPD # 1  Doing well No complaints.  BP 120/67   Pulse (!) 58   Temp 98.8 F (37.1 C) (Oral)   Resp 18   Ht 5\' 6"  (1.676 m)   Wt 96 kg   SpO2 98%   Breastfeeding Unknown   BMI 34.17 kg/m  Results for orders placed or performed during the hospital encounter of 02/12/20 (from the past 24 hour(s))  CBC     Status: Abnormal   Collection Time: 02/13/20  5:05 AM  Result Value Ref Range   WBC 12.1 (H) 4.0 - 10.5 K/uL   RBC 3.46 (L) 3.87 - 5.11 MIL/uL   Hemoglobin 11.2 (L) 12.0 - 15.0 g/dL   HCT 04/15/20 (L) 36 - 46 %   MCV 98.0 80.0 - 100.0 fL   MCH 32.4 26.0 - 34.0 pg   MCHC 33.0 30.0 - 36.0 g/dL   RDW 30.0 51.1 - 02.1 %   Platelets 123 (L) 150 - 400 K/uL   nRBC 0.0 0.0 - 0.2 %   Abdomen is soft and non tender Lochia WNL  PPD # 1  Doing well Routine care Discharge home tomorrow

## 2020-02-14 LAB — RH IG WORKUP (INCLUDES ABO/RH)
ABO/RH(D): O NEG
Fetal Screen: NEGATIVE
Gestational Age(Wks): 40.5
Unit division: 0

## 2020-02-14 NOTE — Discharge Summary (Signed)
Postpartum Discharge Summary  Date of Service updated February 14, 2020     Patient Name: Amy Brock DOB: [birth year] MRN: [id number]  Date of admission: 02/12/2020 Delivery date:02/12/2020  Delivering provider: Dian Queen  Date of discharge: 02/14/2020  Admitting diagnosis: Pregnancy [Z34.90] Vacuum extractor delivery, delivered [O66.5] Intrauterine pregnancy: [redacted]w[redacted]d    Secondary diagnosis:  Active Problems:   Pregnancy   Vacuum extractor delivery, delivered  Additional problems: none    Discharge diagnosis: Term Pregnancy Delivered                                              Post partum procedures:none Augmentation: AROM, Pitocin and Cytotec Complications: None  Hospital course: Induction of Labor With Vaginal Delivery   34y.o. yo G3P3003 at 432w5das admitted to the hospital 02/12/2020 for induction of labor.  Indication for induction: Postdates.  Patient had an uncomplicated labor course as follows: Membrane Rupture Time/Date: 7:38 AM ,02/12/2020   Delivery Method:Vaginal, Vacuum (Extractor)  Episiotomy: Median  Lacerations:  2nd degree;Perineal  Details of delivery can be found in separate delivery note.  Patient had a routine postpartum course. Patient is discharged home 02/14/20.  Newborn Data: Birth date:02/12/2020  Birth time:10:31 AM  Gender:Female  Living status:Living  Apgars:8 ,8  Weight:3884 g   Magnesium Sulfate received: No BMZ received: No Rhophylac:No MMR:No T-DaP:Given prenatally Flu: Yes Transfusion:No  Physical exam  Vitals:   02/13/20 0543 02/13/20 1824 02/13/20 2050 02/14/20 0507  BP: 120/67 108/67 128/74 117/75  Pulse: (!) 58 (!) 59 (!) 54 (!) 58  Resp: _0 Temp: 98.8 F (37.1 C) 98.2 F (36.8 C) 98.4 F (36.9 C) 98.4 F (36.9 C)  TempSrc: Oral Oral Oral Oral  SpO2:  100%    Weight:      Height:       General: alert, cooperative and no distress Lochia: appropriate Uterine Fundus: firm Incision: Healing well with no  significant drainage DVT Evaluation: No evidence of DVT seen on physical exam. Labs: Lab Results  Component Value Date   WBC 12.1 (H) 02/13/2020   HGB 11.2 (L) 02/13/2020   HCT 33.9 (L) 02/13/2020   MCV 98.0 02/13/2020   PLT 123 (L) 02/13/2020   CMP Latest Ref Rng & Units 09/22/2014  Glucose 70 - 99 mg/dL 103(H)  BUN 6 - 23 mg/dL 21  Creatinine 0.50 - 1.10 mg/dL 0.90  Sodium 135 - 145 mmol/L 136  Potassium 3.5 - 5.1 mmol/L 3.7  Chloride 96 - 112 mmol/L 108  CO2 19 - 32 mmol/L 22  Calcium 8.4 - 10.5 mg/dL 8.3(L)  Total Protein 6.0 - 8.3 g/dL 5.3(L)  Total Bilirubin 0.3 - 1.2 mg/dL 0.1(L)  Alkaline Phos 39 - 117 U/L 105  AST 0 - 37 U/L 37  ALT 0 - 35 U/L 18   Edinburgh Score: Edinburgh Postnatal Depression Scale Screening Tool 02/13/2020  I have been able to laugh and see the funny side of things. 0  I have looked forward with enjoyment to things. 0  I have blamed myself unnecessarily when things went wrong. 0  I have been anxious or worried for no good reason. 0  I have felt scared or panicky for no good reason. 0  Things have been getting on top of me. 0  I have been so unhappy that I  have had difficulty sleeping. 0  I have felt sad or miserable. 0  I have been so unhappy that I have been crying. 0  The thought of harming myself has occurred to me. 0  Edinburgh Postnatal Depression Scale Total 0      After visit meds:  Allergies as of 02/14/2020   No Known Allergies     Medication List    TAKE these medications   DHA PO Take 1 capsule by mouth at bedtime.   ibuprofen 600 MG tablet Commonly known as: ADVIL Take 1 tablet (600 mg total) by mouth every 6 (six) hours.   prenatal multivitamin Tabs tablet Take 1 tablet by mouth at bedtime.        Discharge home in stable condition Infant Feeding: Breast Infant Disposition:home with mother Discharge instruction: per After Visit Summary and Postpartum booklet. Activity: Advance as tolerated. Pelvic rest for 6  weeks.  Diet: routine diet Anticipated Birth Control: Unsure Postpartum Appointment:6 weeks Additional Postpartum F/U: not applicable Future Appointments:No future appointments. Follow up Visit:      02/14/2020 [name], MD

## 2020-02-14 NOTE — Lactation Note (Signed)
This note was copied from a baby's chart. Lactation Consultation Note  Patient Name: Girl Alondra Vandeven KPTWS'F Date: 02/14/2020 Reason for consult: Follow-up assessment;Term;Nipple pain/trauma;Infant weight loss;Other (Comment) (5 % weight loss/ milk in)  Baby is 50 hours old  Per mom having nipple soreness, pinching with latch  - mom requested assessment -  LC noted areola edema more on the right compared the left.  LC reviewed reverse pressure exercise , instructed mom on shells between feedings, hand pump and mom brought comfort gels.  Sore nipple and engorgement prevention and tx  Storage of breast milk.  Baby had fed prior to [facility] consult and asleep, noted a recessed chin.  Mom has the [facility] pamphlet with phone numbers.     Maternal Data    Feeding Feeding Type:  (baby recently fed per dad)  LATCH Score ( latch done MBURN )  Latch: Grasps breast easily, tongue down, lips flanged, rhythmical sucking.  Audible Swallowing: A few with stimulation  Type of Nipple: Everted at rest and after stimulation  Comfort (Breast/Nipple): Soft / non-tender  Hold (Positioning): Assistance needed to correctly position infant at breast and maintain latch.  LATCH Score: 8  Interventions Interventions: Breast feeding basics reviewed;Shells;Hand pump  Lactation Tools Discussed/Used Tools: Shells;Pump;Flanges Flange Size: 24;27 Shell Type: Inverted Breast pump type: Manual Pump Review: Setup, frequency, and cleaning;Milk Storage Initiated by:: MAI Date initiated:: 02/14/20   Consult Status Consult Status: Complete Date: 02/14/20    Kathrin Greathouse 02/14/2020, 12:49 PM

## 2020-02-28 IMAGING — US US THYROID
1 series · 13 of 25 positions shown · non-contrast
Comparison: 11/03/2008

CLINICAL DATA: Prior ultrasound follow-up.  Follow-up nodule.

EXAM:
THYROID ULTRASOUND
TECHNIQUE: Ultrasound examination of the thyroid gland and adjacent soft
tissues was performed.

[Series 1: us thyroid · 0.05mm/px · 13 of 47 slices shown]
[im 1/47]
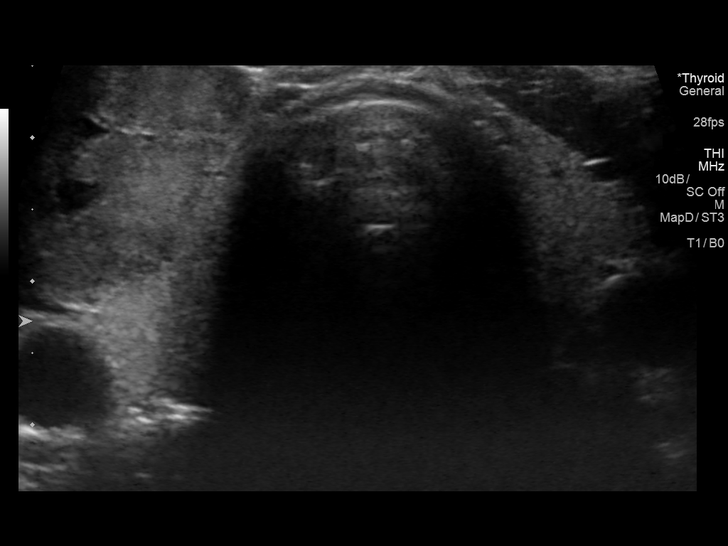
[im 4/47]
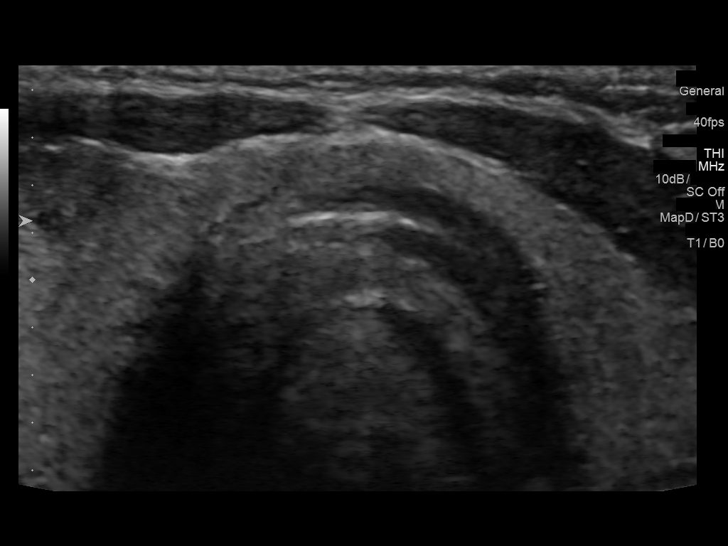
[im 8/47]
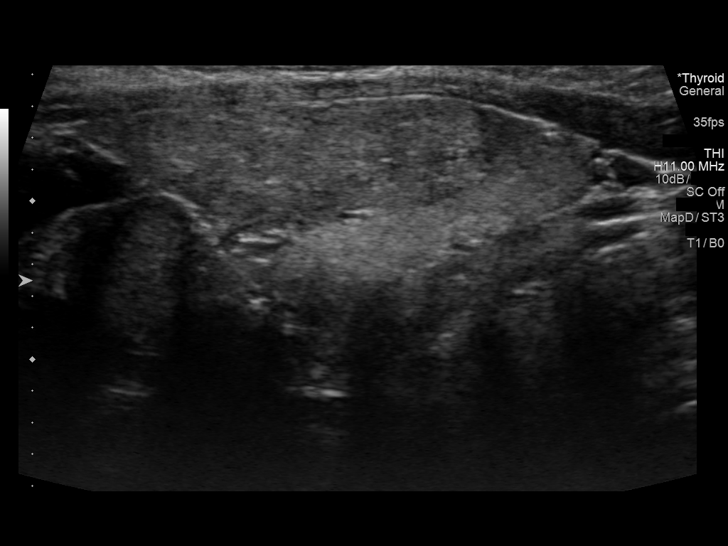
[im 12/47]
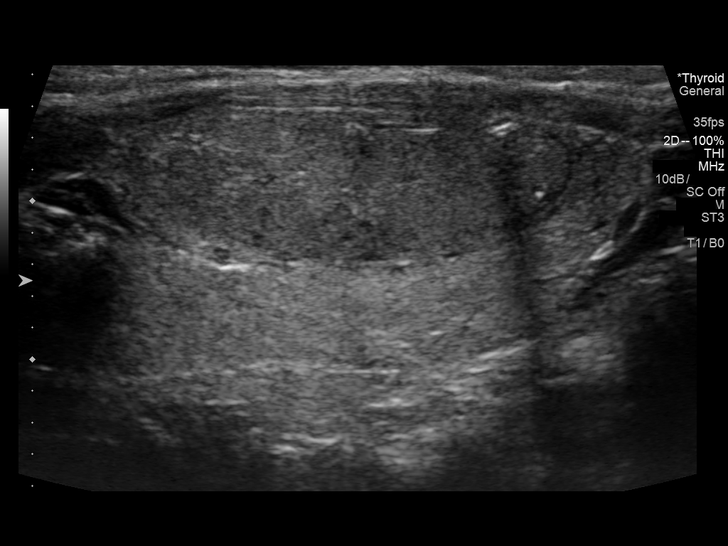
[im 16/47]
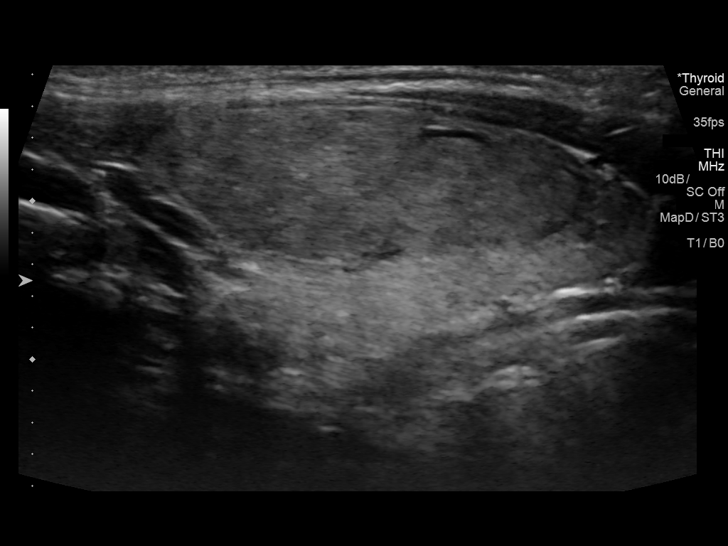
[im 20/47]
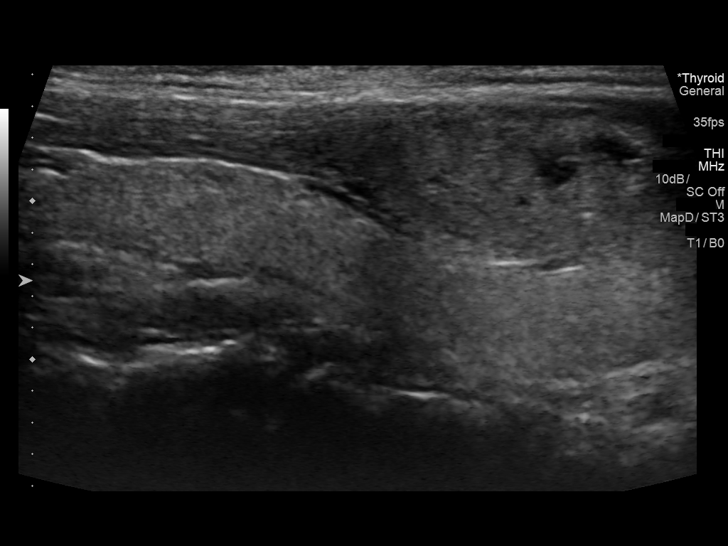
[im 24/47]
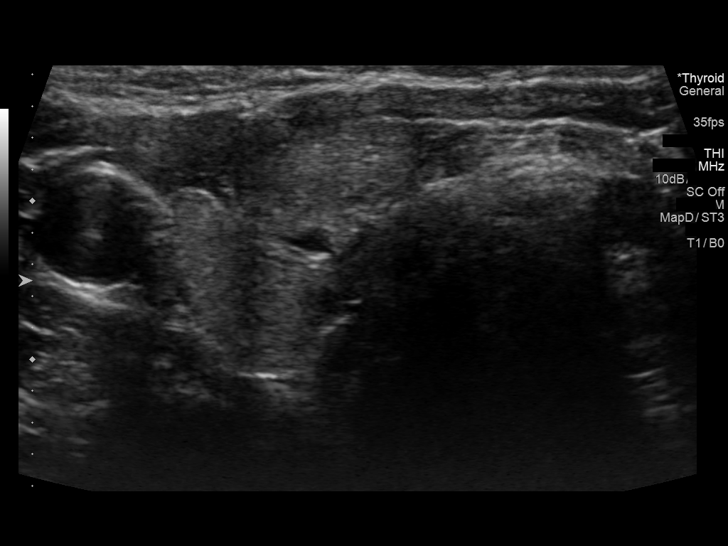
[im 27/47]
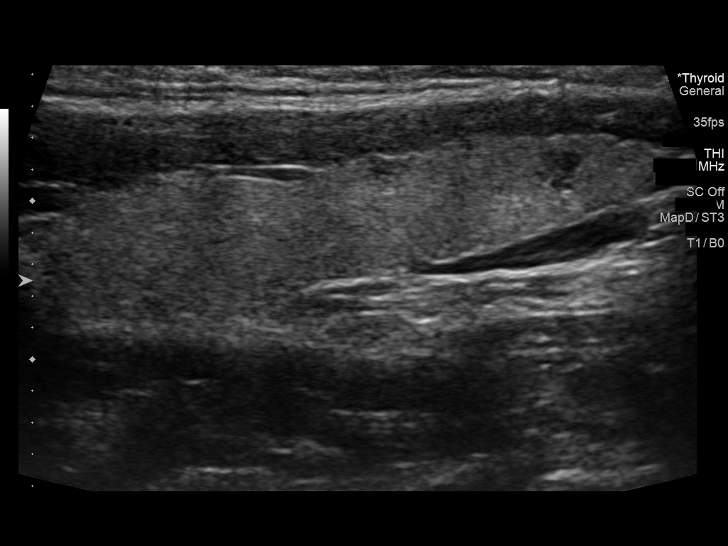
[im 31/47]
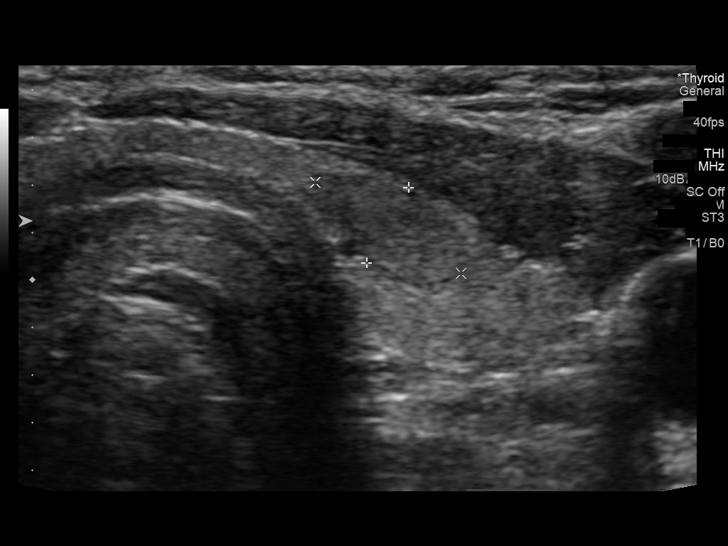
[im 35/47]
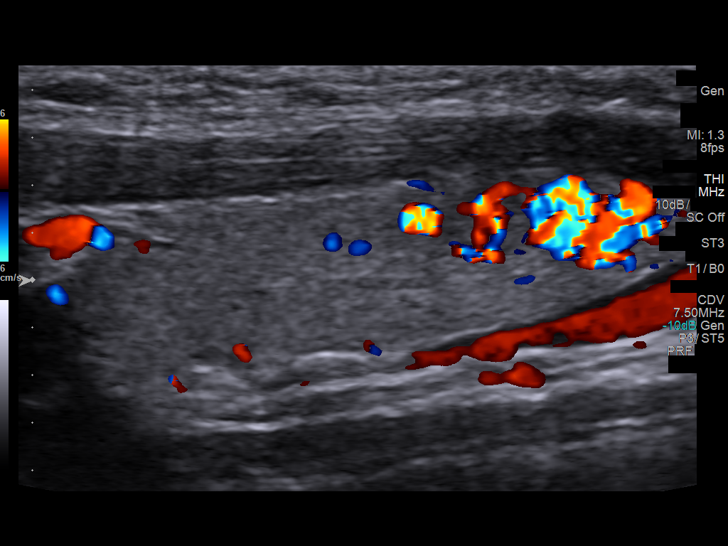
[im 39/47]
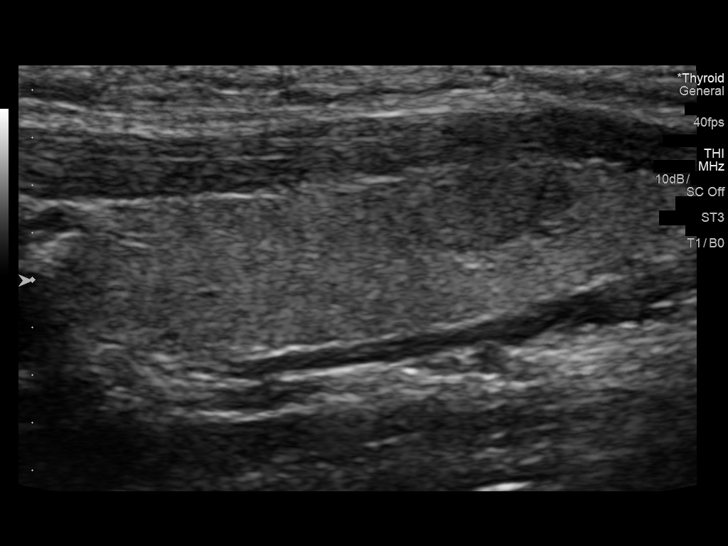
[im 43/47]
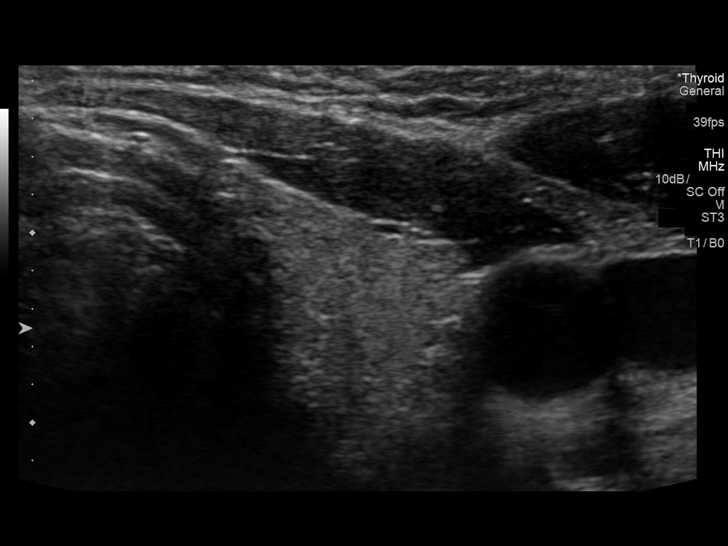
[im 47/47]
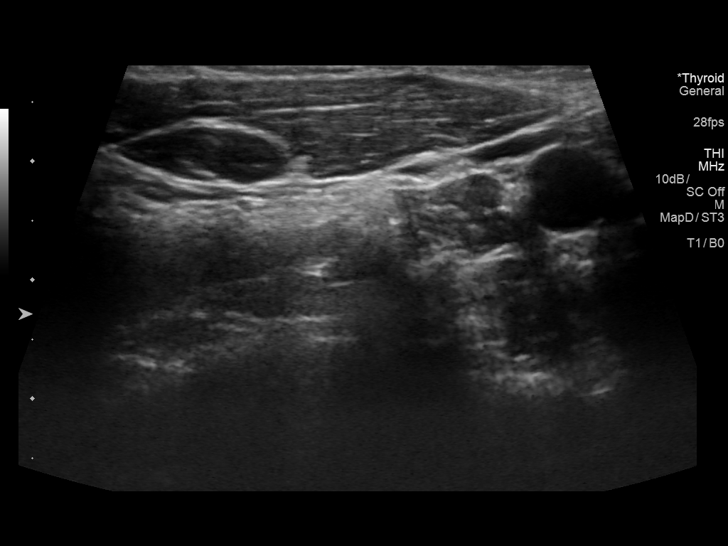

[13 of 25 positions shown; findings below may reference images not displayed]

FINDINGS: Parenchymal Echotexture: Mildly heterogenous

Isthmus: 0.2 cm, previously 0.2 cm

Right lobe: 4.5 x 1.8 x 1.8 cm, previously 4.9 x 1.0 x 1.9 cm

Left lobe: 4.2 x 0.8 x 1.1 cm, previously 4.4 x 0.7 x 1.3 cm

_________________________________________________________

Estimated total number of nodules >/= 1 cm: 1

Number of spongiform nodules >/=  2 cm not described below (TR1): 0

Number of mixed cystic and solid nodules >/= 1.5 cm not described
below (TR2): 0

_________________________________________________________

Right mid nodule 1 measures 2.9 x 2.1 x 2.1 cm and previously
measured 2.4 x 2.0 x 0.9 cm. Biopsy was performed 05/29/2007.

Left lower pole nodule 2 measures 0.8 cm and does not meet criteria
for biopsy nor follow-up.
IMPRESSION: Right nodule 1 is not significantly changed and previously underwent
biopsy.

Left nodule 2 does not meet criteria for biopsy nor follow-up.

The above is in keeping with the ACR TI-RADS recommendations - [HOSPITAL] 4374;[DATE].

## 2020-03-14 ENCOUNTER — Telehealth (HOSPITAL_COMMUNITY): Payer: Self-pay | Admitting: Lactation Services

## 2020-03-14 NOTE — Telephone Encounter (Signed)
Amy Brock called in asking for information on receiving an OP LC consult.  Phone number provided and mother will follow through with a phone call.

## 2020-04-15 ENCOUNTER — Other Ambulatory Visit: Payer: Self-pay

## 2023-12-12 ENCOUNTER — Other Ambulatory Visit: Payer: Self-pay

## 2023-12-12 ENCOUNTER — Ambulatory Visit: Attending: Family

## 2023-12-12 DIAGNOSIS — R279 Unspecified lack of coordination: Secondary | ICD-10-CM | POA: Insufficient documentation

## 2023-12-12 DIAGNOSIS — R293 Abnormal posture: Secondary | ICD-10-CM | POA: Diagnosis present

## 2023-12-12 DIAGNOSIS — M6281 Muscle weakness (generalized): Secondary | ICD-10-CM | POA: Diagnosis present

## 2023-12-12 DIAGNOSIS — M62838 Other muscle spasm: Secondary | ICD-10-CM | POA: Diagnosis present

## 2023-12-12 NOTE — Therapy (Signed)
 OUTPATIENT PHYSICAL THERAPY FEMALE PELVIC EVALUATION   Patient Name: Amy Brock MRN: [id number] DOB:09/19/1985, 38 y.o., female Today's Date: 12/12/2023  END OF SESSION:  PT End of Session - 12/12/23 0804     Visit Number 1    Date for PT Re-Evaluation 05/28/24    Authorization Type BC[employer]    PT Start Time 0803    PT Stop Time 0840    PT Time Calculation (min) 37 min    Activity Tolerance Patient tolerated treatment well    Behavior During Therapy [facility] for tasks assessed/performed             Past Medical History:  Diagnosis Date   Pregnancy induced hypertension    THYROID  CYST 10/28/2008   THYROIDITIS, HX OF 2010   Vaginal Pap smear, abnormal    VSD    congenital, closed spont   Past Surgical History:  Procedure Laterality Date   ANTERIOR CRUCIATE LIGAMENT REPAIR  age 25   TONSILLECTOMY     @ 11/ post op hemorrhage   Patient Active Problem List   Diagnosis Date Noted   Vacuum extractor delivery, delivered 02/12/2020   Indication for care in labor or delivery 08/06/2016   Vaginal delivery 08/06/2016   SVD (spontaneous vaginal delivery) 09/20/2014   Pregnancy 09/19/2014   THYROID  CYST 10/28/2008   VSD 10/28/2008   THYROIDITIS, HX OF 10/28/2008    PCP: NA  REFERRING PROVIDER: Cornelia Dieter, FNP   REFERRING DIAG: M79.10 (ICD-10-CM) - Myalgia  THERAPY DIAG:  Muscle weakness (generalized)  Other muscle spasm  Unspecified lack of coordination  Abnormal posture  Rationale for Evaluation and Treatment: Rehabilitation  ONSET DATE: 4 years ago  SUBJECTIVE:                                                                                                                                                                                           SUBJECTIVE STATEMENT: Pt states that she has had 3 children and after third child she started having pain under Rt rib cage. If she tries to lift something heavy she will have pain in this area. She feels like there  is a physical depression she can see in this area.  Fluid intake: a lot of water, does not track it  PAIN:  Are you having pain? Yes NPRS scale: 10/10 Pain location:  Rt rib cage/abdominal pain  Pain type: spasm Pain description: intermittent - lasts about 30 seconds after lifting or reaching  Aggravating factors: lifting, reaching with lifting Relieving factors: wait for it to go away  PRECAUTIONS: None  RED FLAGS: None   WEIGHT BEARING RESTRICTIONS:  No  FALLS:  Has patient fallen in last 6 months? 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   PLOF: Independent  PATIENT GOALS: decrease pain  PERTINENT HISTORY:  3 vaginal deliveries; Lt knee surgery  BOWEL MOVEMENT: Pain with bowel movement: No Type of bowel movement:Frequency 1x/day and Strain some Fully empty rectum: Yes:   Leakage: No Pads: No Fiber supplement/laxative No  URINATION: Pain with urination: No Fully empty bladder: No Stream: Strong Urgency: No Frequency: 2-4 hours during the day Leakage:  she isn't sure that she is actually leaking, but she finds herself always trying to hold her core in, especially standing Pads: No  INTERCOURSE:  Ability to have vaginal penetration Yes  Pain with intercourse: none DrynessYes - she has been fighting either bacteria or yeast since her last pregnancy - feels like she always feels like she has dry, raw, itching skin Climax: WNL Marinoff Scale: 3/3 Lubricant: No  PREGNANCY: Vaginal deliveries 3 Tearing Yes:   Episiotomy No C-section deliveries 0 Currently pregnant No  PROLAPSE: None   OBJECTIVE:  Note: Objective measures were completed at Evaluation unless otherwise noted.  12/12/23:  PATIENT SURVEYS:   PFIQ-7: 35  COGNITION: Overall cognitive status: Within functional limits for tasks assessed     SENSATION: Light touch: Appears intact  FUNCTIONAL TESTS:  Squat: Lt rotation, Rt weight shift Single leg stance:  Rt:  stable  Lt: large pelvic drop Curl-up test: abdominal distortion and extreme difficulty   LUMBAR A/ROM: Extension 50% with pain, Rt side bend WNL but pain on Lt, Lt side bend 50%,   GAIT: Assistive device utilized: None Comments: WNL  POSTURE: rounded shoulders, forward head, increased lumbar lordosis, increased thoracic kyphosis, anterior pelvic tilt, and Lt lumbar rotation with posterior pelvic rotation  - rotation of rib cage towards the right    PALPATION:   General: tightness in Lt glutes and lumbar paraspinals   Pelvic Alignment: Lt posterior rotation  Abdominal: tenderness inferior to umbilicus; decreased rib cage lateral expansion with inhale                External Perineal Exam: significant dryness, presence of perineal scar tissue                             Internal Pelvic Floor: dryness, perineal scar tissue restriction  Patient confirms identification and approves PT to assess internal pelvic floor and treatment Yes  PELVIC MMT:   MMT eval  Vaginal 2/5, 3 second hold, 8 repeat contractions  Diastasis Recti 2 finger width  (Blank rows = not tested)        TONE: WNL  PROLAPSE: Unable to assess due to paradoxical contraction  TODAY'S TREATMENT:  DATE:  12/12/23  EVAL  Neuromuscular re-education: Pt provides verbal consent for internal vaginal/rectal pelvic floor exam. Internal vaginal pelvic floor muscle contraction training Quick flicks Diaphragmatic breathing with use of theraband to help improve lateral rib cage excursion Exercises: HEP Cat cow 10x Baby cobra 5x Open books 10x bil Lower trunk rotation 2 x 10 Therapeutic activities: Lubricant Vulvar moisturizing Talk to MD about benefit of estrogen cream Discussed anatomy and relationship between knee up to rib cage     PATIENT EDUCATION:  Education details: See  above Person educated: Patient Education method: Explanation, Demonstration, Tactile cues, Verbal cues, and Handouts Education comprehension: verbalized understanding  HOME EXERCISE PROGRAM: [id number]  ASSESSMENT:  CLINICAL IMPRESSION: Patient is a 38 y.o. female who was seen today for physical therapy evaluation and treatment for Rt upper abdominal pain and core weakness. Exam findings notable for decreased lumbar A/ROM (extension and Lt side bending most notable), abnormal posture, decreased pelvic stability/pelvic drop with standing on Lt lower extremity, notable Lt rotation with squat, 2 finger width diastasis with distortion and notable weakness with curl-up, pelvic floor muscle weakness and decreased endurance, decreased coordination with repeated contractions, vulvar dryness, perineal scar tissue restriction, and paradoxical contraction with bearing down. Signs and symptoms are most consistent with deep core weakness (transversus abdominus and pelvic floor muscles) and poor coordination; this weakness in combination with lumbar/pelvic rotation may be causing diaphragmatic spasm/shortening that is responsible for her pain. Working on core strengthening and normalizing length/tension relationships throughout core should help reduce pain and help her feel stronger overall. Initial treatment included pelvic floor muscle contraction, diaphragmatic breathing breathing, and mobility activities; we also discussed using lubricant and performing regular vaginal moisturizing to help reduce dryness. She was encouraged to talk with MD about using topical estrogen cream to help with dryness and restoring normal vaginal pH. She will continue to benefit from skilled PT intervention in order to decrease Rt sided abdominal pain, improve core strength, increase symmetry throughout core and pelvis, and progress functional strengthening program.   OBJECTIVE IMPAIRMENTS: decreased activity tolerance, decreased  coordination, decreased endurance, decreased mobility, decreased ROM, decreased strength, increased fascial restrictions, increased muscle spasms, impaired tone, postural dysfunction, and pain.   ACTIVITY LIMITATIONS: lifting and reaching  PARTICIPATION LIMITATIONS: cleaning, laundry, interpersonal relationship, community activity, and occupation  PERSONAL FACTORS: 1 comorbidity: medical history  are also affecting patient's functional outcome.   REHAB POTENTIAL: Good  CLINICAL DECISION MAKING: Stable/uncomplicated  EVALUATION COMPLEXITY: Low   GOALS: Goals reviewed with patient? Yes  SHORT TERM GOALS: Target date: 01/09/2024   Pt will be independent with HEP.   Baseline: Goal status: INITIAL  2.  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  Baseline:  Goal status: INITIAL  3.  Pt will demonstrate no Lt posterior pelvic rotation in order to help reduce Rt upper quadrant abdominal pain.  Baseline:  Goal status: INITIAL  4.  Pt will demonstrate improved awareness of core bracing and be able to actively relax in order to decrease pain and help normalize posture.  Baseline:  Goal status: INITIAL    LONG TERM GOALS: Target date: 05/28/24  Pt will be independent with advanced HEP.   Baseline:  Goal status: INITIAL  2.  Pt will report 75% improvement in pain in order to perform childcare without difficulty. Baseline:  Goal status: INITIAL  3.  Pt will be able to perform single leg stance on Lt without pelvic drop in order demonstrate stability  that will prevent low back/pelvic pain.  Baseline:  Goal status: INITIAL  4.  Pt will be able to perform curl-up test without abdominal distortion or difficulty in order to demonstrate improved core strength to decrease low back/pelvic pain.   Baseline:  Goal status: INITIAL  PLAN:  PT FREQUENCY: 1-2x/week  PT DURATION: 6 months  PLANNED INTERVENTIONS: 97110-Therapeutic  exercises, 97530- Therapeutic activity, 97112- Neuromuscular re-education, 97535- Self Care, [id number]- Manual therapy, Dry Needling, and Biofeedback  PLAN FOR NEXT SESSION: Abdominal manual techniques; pelvic stability and core training activities; check thoracic mobility   Verlena Glenn, PT, DPT05/01/259:27 AM

## 2023-12-26 ENCOUNTER — Ambulatory Visit

## 2024-02-04 ENCOUNTER — Ambulatory Visit

## 2024-02-19 ENCOUNTER — Ambulatory Visit: Payer: Self-pay

## 2024-02-26 ENCOUNTER — Encounter

## 2024-03-04 ENCOUNTER — Ambulatory Visit: Payer: Self-pay

## 2024-03-11 ENCOUNTER — Ambulatory Visit: Payer: Self-pay | Attending: Family

## 2024-03-11 DIAGNOSIS — R293 Abnormal posture: Secondary | ICD-10-CM | POA: Insufficient documentation

## 2024-03-11 DIAGNOSIS — M6281 Muscle weakness (generalized): Secondary | ICD-10-CM | POA: Insufficient documentation

## 2024-03-11 DIAGNOSIS — M62838 Other muscle spasm: Secondary | ICD-10-CM | POA: Insufficient documentation

## 2024-03-11 DIAGNOSIS — R279 Unspecified lack of coordination: Secondary | ICD-10-CM | POA: Insufficient documentation

## 2024-03-11 NOTE — Patient Instructions (Signed)
 Muscle Energy Technique: Sitting: press Lt heel into the ground while lifting Rt knee and pressing Rt hand into thigh - hold for 5 seconds and repeat 5 tines Alternate pressing knees in on hands and pressing knees out on hands - hold each 5 seconds and go through 5 rounds

## 2024-03-11 NOTE — Therapy (Signed)
 OUTPATIENT PHYSICAL THERAPY FEMALE PELVIC TREATMENT   Patient Name: Amy Brock MRN: [id number] DOB:30-May-1986, 38 y.o., female Today's Date: 03/11/2024  END OF SESSION:  PT End of Session - 03/11/24 0800     Visit Number 2    Date for PT Re-Evaluation 05/28/24    Authorization Type [employer]    PT Start Time 0800    PT Stop Time 0840    PT Time Calculation (min) 40 min    Activity Tolerance Patient tolerated treatment well    Behavior During Therapy WFL for tasks assessed/performed           Past Medical History:  Diagnosis Date   Pregnancy induced hypertension    THYROID  CYST 10/28/2008   THYROIDITIS, HX OF 2010   Vaginal Pap smear, abnormal    VSD    congenital, closed spont   Past Surgical History:  Procedure Laterality Date   ANTERIOR CRUCIATE LIGAMENT REPAIR  age 85   TONSILLECTOMY     @ 11/ post op hemorrhage   Patient Active Problem List   Diagnosis Date Noted   Vacuum extractor delivery, delivered 02/12/2020   Indication for care in labor or delivery 08/06/2016   Vaginal delivery 08/06/2016   SVD (spontaneous vaginal delivery) 09/20/2014   Pregnancy 09/19/2014   THYROID  CYST 10/28/2008   VSD 10/28/2008   THYROIDITIS, HX OF 10/28/2008    PCP: NA  REFERRING PROVIDER: Ann Darice HERO, FNP   REFERRING DIAG: M79.10 (ICD-10-CM) - Myalgia  THERAPY DIAG:  Muscle weakness (generalized)  Other muscle spasm  Unspecified lack of coordination  Abnormal posture  Rationale for Evaluation and Treatment: Rehabilitation  ONSET DATE: 4 years ago  SUBJECTIVE:                                                                                                                                                                                           SUBJECTIVE STATEMENT: Pt states that she has been trying to work on exercises. She states that her Rt rib cage pain that brought her to PT is gone, but she is having Lt sided low back pain.   PAIN: 03/11/24 Are you  having pain? Yes NPRS scale: 4/10, 10/10 Pain location: Lt low back  Pain type: spasm Pain description: intermittent - lasts about 30 seconds after lifting or reaching  Aggravating factors: lifting, reaching with lifting, trying to stand up Relieving factors: wait for it to go away  PRECAUTIONS: None  RED FLAGS: None   WEIGHT BEARING RESTRICTIONS: No  FALLS:  Has patient fallen in last 6 months? No  OCCUPATION: OT  ACTIVITY LEVEL : in PT for  her knee   PLOF: Independent  PATIENT GOALS: decrease pain  PERTINENT HISTORY:  3 vaginal deliveries; Lt knee surgery  BOWEL MOVEMENT: Pain with bowel movement: No Type of bowel movement:Frequency 1x/day and Strain some Fully empty rectum: Yes:   Leakage: No Pads: No Fiber supplement/laxative No  URINATION: Pain with urination: No Fully empty bladder: No Stream: Strong Urgency: No Frequency: 2-4 hours during the day Leakage: she isn't sure that she is actually leaking, but she finds herself always trying to hold her core in, especially standing Pads: No  INTERCOURSE:  Ability to have vaginal penetration Yes  Pain with intercourse: none DrynessYes - she has been fighting either bacteria or yeast since her last pregnancy - feels like she always feels like she has dry, raw, itching skin Climax: WNL Marinoff Scale: 3/3 Lubricant: No  PREGNANCY: Vaginal deliveries 3 Tearing Yes:   Episiotomy No C-section deliveries 0 Currently pregnant No  PROLAPSE: None   OBJECTIVE:  Note: Objective measures were completed at Evaluation unless otherwise noted.  12/12/23:  PATIENT SURVEYS:   PFIQ-7: 75  COGNITION: Overall cognitive status: Within functional limits for tasks assessed     SENSATION: Light touch: Appears intact  FUNCTIONAL TESTS:  Squat: Lt rotation, Rt weight shift Single leg stance:  Rt: stable  Lt: large pelvic drop Curl-up test: abdominal distortion and extreme difficulty   LUMBAR A/ROM: Extension  50% with pain, Rt side bend WNL but pain on Lt, Lt side bend 50%,   GAIT: Assistive device utilized: None Comments: WNL  POSTURE: rounded shoulders, forward head, increased lumbar lordosis, increased thoracic kyphosis, anterior pelvic tilt, and Lt lumbar rotation with posterior pelvic rotation - rotation of rib cage towards the right    PALPATION:   General: tightness in Lt glutes and lumbar paraspinals   Pelvic Alignment: Lt posterior rotation  Abdominal: tenderness inferior to umbilicus; decreased rib cage lateral expansion with inhale                External Perineal Exam: significant dryness, presence of perineal scar tissue                             Internal Pelvic Floor: dryness, perineal scar tissue restriction  Patient confirms identification and approves PT to assess internal pelvic floor and treatment Yes  PELVIC MMT:   MMT eval  Vaginal 2/5, 3 second hold, 8 repeat contractions  Diastasis Recti 2 finger width  (Blank rows = not tested)        TONE: WNL  PROLAPSE: Unable to assess due to paradoxical contraction  TODAY'S TREATMENT:                                                                                                                              DATE:  03/11/24 Manual: Instrument assisted soft tissue mobilization to bil lumbar paraspinals and SIJs Neuromuscular re-education: Muscle energy techniques for Lt  anterior/Rt posterior rotation in supine 5 rounds 10 second holds Shotgun technique 5 rounds Transversus abdominus training with multimodal cues for improved motor control and breath coordination Supine reverse table top heel taps 10x bil Modified supine dead bug  Exercises: Supine piriformis stretch 60 sec bil   12/12/23  EVAL  Neuromuscular re-education: Pt provides verbal consent for internal vaginal/rectal pelvic floor exam. Internal vaginal pelvic floor muscle contraction training Quick flicks Diaphragmatic breathing with use of  theraband to help improve lateral rib cage excursion Exercises: HEP Cat cow 10x Baby cobra 5x Open books 10x bil Lower trunk rotation 2 x 10 Therapeutic activities: Lubricant Vulvar moisturizing Talk to MD about benefit of estrogen cream Discussed anatomy and relationship between knee up to rib cage     PATIENT EDUCATION:  Education details: See above Person educated: Patient Education method: Explanation, Demonstration, Tactile cues, Verbal cues, and Handouts Education comprehension: verbalized understanding  HOME EXERCISE PROGRAM: [id number]  ASSESSMENT:  CLINICAL IMPRESSION: Patient is a 38 y.o. female who was seen today for physical therapy evaluation and treatment for Rt upper abdominal pain and core weakness. Pt not having any of the rib.abdominal pain that brought her to PT, but she is having much more low back pain, focused on the Lt. She was found to still have notable pelvic rotation that may be contributing. We performed muscle energy techniques to help reduce asymmetry from pelvic rotation and then shot gun technique to help stabilize. She had no pain with these techniques. She was able to perform core training and was able to progress into other exercises. HEP updated. She will continue to benefit from skilled PT intervention in order to decrease Rt sided abdominal pain, improve core strength, increase symmetry throughout core and pelvis, and progress functional strengthening program.   OBJECTIVE IMPAIRMENTS: decreased activity tolerance, decreased coordination, decreased endurance, decreased mobility, decreased ROM, decreased strength, increased fascial restrictions, increased muscle spasms, impaired tone, postural dysfunction, and pain.   ACTIVITY LIMITATIONS: lifting and reaching  PARTICIPATION LIMITATIONS: cleaning, laundry, interpersonal relationship, community activity, and occupation  PERSONAL FACTORS: 1 comorbidity: medical history are also affecting patient's  functional outcome.   REHAB POTENTIAL: Good  CLINICAL DECISION MAKING: Stable/uncomplicated  EVALUATION COMPLEXITY: Low   GOALS: Goals reviewed with patient? Yes  SHORT TERM GOALS: Updated 03/11/24   Pt will be independent with HEP.   Baseline: Goal status: IN PROGRESS 03/11/24  2.  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  Baseline:  Goal status: IN PROGRESS 03/11/24  3.  Pt will demonstrate no Lt posterior pelvic rotation in order to help reduce Rt upper quadrant abdominal pain.  Baseline:  Goal status: IN PROGRESS 03/11/24  4.  Pt will demonstrate improved awareness of core bracing and be able to actively relax in order to decrease pain and help normalize posture.  Baseline:  Goal status: IN PROGRESS 03/11/24    LONG TERM GOALS: Updated 03/11/24  Pt will be independent with advanced HEP.   Baseline:  Goal status: IN PROGRESS 03/11/24  2.  Pt will report 75% improvement in pain in order to perform childcare without difficulty. Baseline:  Goal status: IN PROGRESS 03/11/24  3.  Pt will be able to perform single leg stance on Lt without pelvic drop in order demonstrate stability that will prevent low back/pelvic pain.  Baseline:  Goal status: IN PROGRESS 03/11/24  4.  Pt will be able to perform curl-up test without abdominal distortion or difficulty in order  to demonstrate improved core strength to decrease low back/pelvic pain.   Baseline:  Goal status: IN PROGRESS 03/11/24  PLAN:  PT FREQUENCY: 1-2x/week  PT DURATION: 6 months  PLANNED INTERVENTIONS: 97110-Therapeutic exercises, 97530- Therapeutic activity, 97112- Neuromuscular re-education, 97535- Self Care, [id number]- Manual therapy, Dry Needling, and Biofeedback  PLAN FOR NEXT SESSION: Abdominal manual techniques; pelvic stability and core training activities; check thoracic mobility   Josette Mares, PT, DPT07/30/258:51 AM

## 2024-04-23 ENCOUNTER — Ambulatory Visit: Attending: Family

## 2024-04-23 DIAGNOSIS — R279 Unspecified lack of coordination: Secondary | ICD-10-CM | POA: Diagnosis present

## 2024-04-23 DIAGNOSIS — M6281 Muscle weakness (generalized): Secondary | ICD-10-CM | POA: Insufficient documentation

## 2024-04-23 DIAGNOSIS — M62838 Other muscle spasm: Secondary | ICD-10-CM | POA: Insufficient documentation

## 2024-04-23 DIAGNOSIS — R293 Abnormal posture: Secondary | ICD-10-CM | POA: Insufficient documentation

## 2024-04-23 NOTE — Therapy (Signed)
 OUTPATIENT PHYSICAL THERAPY FEMALE PELVIC TREATMENT   Patient Name: Amy Brock MRN: [id number] DOB:1986-04-07, 38 y.o., female Today's Date: 04/23/2024  END OF SESSION:  PT End of Session - 04/23/24 1448     Visit Number 3    Date for PT Re-Evaluation 05/28/24    Authorization Type [employer]    PT Start Time 1448    PT Stop Time 1527    PT Time Calculation (min) 39 min    Activity Tolerance Patient tolerated treatment well    Behavior During Therapy WFL for tasks assessed/performed           Past Medical History:  Diagnosis Date   Pregnancy induced hypertension    THYROID  CYST 10/28/2008   THYROIDITIS, HX OF 2010   Vaginal Pap smear, abnormal    VSD    congenital, closed spont   Past Surgical History:  Procedure Laterality Date   ANTERIOR CRUCIATE LIGAMENT REPAIR  age 83   TONSILLECTOMY     @ 11/ post op hemorrhage   Patient Active Problem List   Diagnosis Date Noted   Vacuum extractor delivery, delivered 02/12/2020   Indication for care in labor or delivery 08/06/2016   Vaginal delivery 08/06/2016   SVD (spontaneous vaginal delivery) 09/20/2014   Pregnancy 09/19/2014   THYROID  CYST 10/28/2008   VSD 10/28/2008   THYROIDITIS, HX OF 10/28/2008    PCP: NA  REFERRING PROVIDER: Ann Darice HERO, FNP   REFERRING DIAG: M79.10 (ICD-10-CM) - Myalgia  THERAPY DIAG:  Muscle weakness (generalized)  Other muscle spasm  Unspecified lack of coordination  Abnormal posture  Rationale for Evaluation and Treatment: Rehabilitation  ONSET DATE: 4 years ago  SUBJECTIVE:                                                                                                                                                                                           SUBJECTIVE STATEMENT: Pt states that she has had one episode of rib cage pain.   PAIN: 04/23/24 Are you having pain? Yes NPRS scale: 4/10 Pain location: Lt low back  Pain type: spasm Pain description:  intermittent - lasts about 30 seconds after lifting or reaching  Aggravating factors: lifting, reaching with lifting, trying to stand up Relieving factors: wait for it to go away  PRECAUTIONS: None  RED FLAGS: None   WEIGHT BEARING RESTRICTIONS: No  FALLS:  Has patient fallen in last 6 months? 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   PLOF: Independent  PATIENT GOALS: decrease pain  PERTINENT HISTORY:  3 vaginal deliveries; Lt knee surgery  BOWEL MOVEMENT:  Pain with bowel movement: No Type of bowel movement:Frequency 1x/day and Strain some Fully empty rectum: Yes:   Leakage: No Pads: No Fiber supplement/laxative No  URINATION: Pain with urination: No Fully empty bladder: No Stream: Strong Urgency: No Frequency: 2-4 hours during the day Leakage: she isn't sure that she is actually leaking, but she finds herself always trying to hold her core in, especially standing Pads: No  INTERCOURSE:  Ability to have vaginal penetration Yes  Pain with intercourse: none DrynessYes - she has been fighting either bacteria or yeast since her last pregnancy - feels like she always feels like she has dry, raw, itching skin Climax: WNL Marinoff Scale: 3/3 Lubricant: No  PREGNANCY: Vaginal deliveries 3 Tearing Yes:   Episiotomy No C-section deliveries 0 Currently pregnant No  PROLAPSE: None   OBJECTIVE:  Note: Objective measures were completed at Evaluation unless otherwise noted.  12/12/23:  PATIENT SURVEYS:   PFIQ-7: 56  COGNITION: Overall cognitive status: Within functional limits for tasks assessed     SENSATION: Light touch: Appears intact  FUNCTIONAL TESTS:  Squat: Lt rotation, Rt weight shift Single leg stance:  Rt: stable  Lt: large pelvic drop Curl-up test: abdominal distortion and extreme difficulty   LUMBAR A/ROM: Extension 50% with pain, Rt side bend WNL but pain on Lt, Lt side bend 50%,   GAIT: Assistive device utilized:  None Comments: WNL  POSTURE: rounded shoulders, forward head, increased lumbar lordosis, increased thoracic kyphosis, anterior pelvic tilt, and Lt lumbar rotation with posterior pelvic rotation - rotation of rib cage towards the right    PALPATION:   General: tightness in Lt glutes and lumbar paraspinals   Pelvic Alignment: Lt posterior rotation  Abdominal: tenderness inferior to umbilicus; decreased rib cage lateral expansion with inhale                External Perineal Exam: significant dryness, presence of perineal scar tissue                             Internal Pelvic Floor: dryness, perineal scar tissue restriction  Patient confirms identification and approves PT to assess internal pelvic floor and treatment Yes  PELVIC MMT:   MMT eval  Vaginal 2/5, 3 second hold, 8 repeat contractions  Diastasis Recti 2 finger width  (Blank rows = not tested)        TONE: WNL  PROLAPSE: Unable to assess due to paradoxical contraction  TODAY'S TREATMENT:                                                                                                                              DATE:  04/23/24 Neuromuscular re-education: Modified dead bug 10x bil Bird dog 10x bil Bear plank lifts 10x Modified side plank with clam shell 10x  Exercises: Cat cow 2 x 10 Seated piriformis stretch 30 sec bil Seated hamstring stretch 30 sec bil Therapeutic activities:  3 way kick 10x each, bil Standing shoulder extensions + green band 2 x 10 Pallof press + green band 10x bil Single leg hip hinge 10x bil Single leg hip hinge with pelvic rotation 10x bil   03/11/24 Manual: Instrument assisted soft tissue mobilization to bil lumbar paraspinals and SIJs Neuromuscular re-education: Muscle energy techniques for Lt anterior/Rt posterior rotation in supine 5 rounds 10 second holds Shotgun technique 5 rounds Transversus abdominus training with multimodal cues for improved motor control and breath  coordination Supine reverse table top heel taps 10x bil Modified supine dead bug  Exercises: Supine piriformis stretch 60 sec bil   12/12/23  EVAL  Neuromuscular re-education: Pt provides verbal consent for internal vaginal/rectal pelvic floor exam. Internal vaginal pelvic floor muscle contraction training Quick flicks Diaphragmatic breathing with use of theraband to help improve lateral rib cage excursion Exercises: HEP Cat cow 10x Baby cobra 5x Open books 10x bil Lower trunk rotation 2 x 10 Therapeutic activities: Lubricant Vulvar moisturizing Talk to MD about benefit of estrogen cream Discussed anatomy and relationship between knee up to rib cage     PATIENT EDUCATION:  Education details: See above Person educated: Patient Education method: Explanation, Demonstration, Tactile cues, Verbal cues, and Handouts Education comprehension: verbalized understanding  HOME EXERCISE PROGRAM: [id number]  ASSESSMENT:  CLINICAL IMPRESSION: Patient is a 38 y.o. female who was seen today for physical therapy evaluation and treatment for Rt upper abdominal pain and core weakness. Pt has not noticed much change in the last month with pain, but has not been consistently working on exercises. We progressed core strengthening and mobility today with good tolerance. She does require cues to help engage core to support pelvis and low back more appropriately.  She will continue to benefit from skilled PT intervention in order to decrease Rt sided abdominal pain, improve core strength, increase symmetry throughout core and pelvis, and progress functional strengthening program.   OBJECTIVE IMPAIRMENTS: decreased activity tolerance, decreased coordination, decreased endurance, decreased mobility, decreased ROM, decreased strength, increased fascial restrictions, increased muscle spasms, impaired tone, postural dysfunction, and pain.   ACTIVITY LIMITATIONS: lifting and reaching  PARTICIPATION  LIMITATIONS: cleaning, laundry, interpersonal relationship, community activity, and occupation  PERSONAL FACTORS: 1 comorbidity: medical history are also affecting patient's functional outcome.   REHAB POTENTIAL: Good  CLINICAL DECISION MAKING: Stable/uncomplicated  EVALUATION COMPLEXITY: Low   GOALS: Goals reviewed with patient? Yes  SHORT TERM GOALS: Updated 04/23/24   Pt will be independent with HEP.   Baseline: Goal status: MET 04/23/24  2.  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  Baseline:  Goal status: MET 04/23/24  3.  Pt will demonstrate no Lt posterior pelvic rotation in order to help reduce Rt upper quadrant abdominal pain.  Baseline:  Goal status: IN PROGRESS 04/23/24  4.  Pt will demonstrate improved awareness of core bracing and be able to actively relax in order to decrease pain and help normalize posture.  Baseline:  Goal status: IN PROGRESS 04/23/24    LONG TERM GOALS: Updated 04/23/24  Pt will be independent with advanced HEP.   Baseline:  Goal status: IN PROGRESS 04/23/24  2.  Pt will report 75% improvement in pain in order to perform childcare without difficulty. Baseline:  Goal status: IN PROGRESS 04/23/24  3.  Pt will be able to perform single leg stance on Lt without pelvic drop in order demonstrate stability that will prevent low back/pelvic pain.  Baseline:  Goal status:  IN PROGRESS 04/23/24  4.  Pt will be able to perform curl-up test without abdominal distortion or difficulty in order to demonstrate improved core strength to decrease low back/pelvic pain.   Baseline:  Goal status: IN PROGRESS 04/23/24  PLAN:  PT FREQUENCY: 1-2x/week  PT DURATION: 6 months  PLANNED INTERVENTIONS: 97110-Therapeutic exercises, 97530- Therapeutic activity, 97112- Neuromuscular re-education, 97535- Self Care, [id number]- Manual therapy, Dry Needling, and Biofeedback  PLAN FOR NEXT SESSION: Abdominal manual  techniques; pelvic stability and core training activities; check thoracic mobility   Josette Mares, PT, DPT09/11/253:34 PM

## 2024-04-27 ENCOUNTER — Ambulatory Visit: Attending: Family

## 2024-05-05 ENCOUNTER — Ambulatory Visit

## 2024-05-05 DIAGNOSIS — R293 Abnormal posture: Secondary | ICD-10-CM

## 2024-05-05 DIAGNOSIS — R279 Unspecified lack of coordination: Secondary | ICD-10-CM

## 2024-05-05 DIAGNOSIS — M6281 Muscle weakness (generalized): Secondary | ICD-10-CM

## 2024-05-05 NOTE — Therapy (Signed)
 OUTPATIENT PHYSICAL THERAPY FEMALE PELVIC TREATMENT   Patient Name: Amy Brock MRN: [id number] DOB:[birth year], 38 y.o., female Today's Date: 05/05/2024  END OF SESSION:  PT End of Session - 05/05/24 0804     Visit Number 4    Date for Recertification  05/28/24    Authorization Type [employer]    PT Start Time 0803    PT Stop Time 0841    PT Time Calculation (min) 38 min    Activity Tolerance Patient tolerated treatment well    Behavior During Therapy [facility] for tasks assessed/performed           Past Medical History:  Diagnosis Date   Pregnancy induced hypertension    THYROID  CYST 10/28/2008   THYROIDITIS, HX OF 2010   Vaginal Pap smear, abnormal    VSD    congenital, closed spont   Past Surgical History:  Procedure Laterality Date   ANTERIOR CRUCIATE LIGAMENT REPAIR  age 70   TONSILLECTOMY     @ 11/ post op hemorrhage   Patient Active Problem List   Diagnosis Date Noted   Vacuum extractor delivery, delivered 02/12/2020   Indication for care in labor or delivery 08/06/2016   Vaginal delivery 08/06/2016   SVD (spontaneous vaginal delivery) 09/20/2014   Pregnancy 09/19/2014   THYROID  CYST 10/28/2008   VSD 10/28/2008   THYROIDITIS, HX OF 10/28/2008    PCP: NA  REFERRING PROVIDER: Ann Darice HERO, FNP   REFERRING DIAG: M79.10 (ICD-10-CM) - Myalgia  THERAPY DIAG:  Muscle weakness (generalized)  Unspecified lack of coordination  Abnormal posture  Rationale for Evaluation and Treatment: Rehabilitation  ONSET DATE: 4 years ago  SUBJECTIVE:                                                                                                                                                                                           SUBJECTIVE STATEMENT: Pt states that she has been able to get to some exercises daily. Pt states that low back is about the same. She has not had any of the rib pain.   PAIN: 05/05/24 Are you having pain? Yes NPRS scale: 0/10 Pain  location: Lt low back  Pain type: spasm Pain description: intermittent - lasts about 30 seconds after lifting or reaching  Aggravating factors: lifting, reaching with lifting, trying to stand up Relieving factors: wait for it to go away  PRECAUTIONS: None  RED FLAGS: None   WEIGHT BEARING RESTRICTIONS: No  FALLS:  Has patient fallen in last 6 months? 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   PLOF: Independent  PATIENT GOALS:  decrease pain  PERTINENT HISTORY:  3 vaginal deliveries; Lt knee surgery  BOWEL MOVEMENT: Pain with bowel movement: No Type of bowel movement:Frequency 1x/day and Strain some Fully empty rectum: Yes:   Leakage: No Pads: No Fiber supplement/laxative No  URINATION: Pain with urination: No Fully empty bladder: No Stream: Strong Urgency: No Frequency: 2-4 hours during the day Leakage: she isn't sure that she is actually leaking, but she finds herself always trying to hold her core in, especially standing Pads: No  INTERCOURSE:  Ability to have vaginal penetration Yes  Pain with intercourse: none DrynessYes - she has been fighting either bacteria or yeast since her last pregnancy - feels like she always feels like she has dry, raw, itching skin Climax: WNL Marinoff Scale: 3/3 Lubricant: No  PREGNANCY: Vaginal deliveries 3 Tearing Yes:   Episiotomy No C-section deliveries 0 Currently pregnant No  PROLAPSE: None   OBJECTIVE:  Note: Objective measures were completed at Evaluation unless otherwise noted.  12/12/23:  PATIENT SURVEYS:   PFIQ-7: 76  COGNITION: Overall cognitive status: Within functional limits for tasks assessed     SENSATION: Light touch: Appears intact  FUNCTIONAL TESTS:  Squat: Lt rotation, Rt weight shift Single leg stance:  Rt: stable  Lt: large pelvic drop Curl-up test: abdominal distortion and extreme difficulty   LUMBAR A/ROM: Extension 50% with pain, Rt side bend WNL but pain on Lt,  Lt side bend 50%,   GAIT: Assistive device utilized: None Comments: WNL  POSTURE: rounded shoulders, forward head, increased lumbar lordosis, increased thoracic kyphosis, anterior pelvic tilt, and Lt lumbar rotation with posterior pelvic rotation - rotation of rib cage towards the right    PALPATION:   General: tightness in Lt glutes and lumbar paraspinals   Pelvic Alignment: Lt posterior rotation  Abdominal: tenderness inferior to umbilicus; decreased rib cage lateral expansion with inhale                External Perineal Exam: significant dryness, presence of perineal scar tissue                             Internal Pelvic Floor: dryness, perineal scar tissue restriction  Patient confirms identification and approves PT to assess internal pelvic floor and treatment Yes  PELVIC MMT:   MMT eval  Vaginal 2/5, 3 second hold, 8 repeat contractions  Diastasis Recti 2 finger width  (Blank rows = not tested)        TONE: WNL  PROLAPSE: Unable to assess due to paradoxical contraction  TODAY'S TREATMENT:                                                                                                                              DATE:  05/05/24 Neuromuscular re-education: Dead bug with bil LE elevated 10x bil Single leg bridge 10x bil Lower trunk rotation + elevated LE 10x  Half kneeling chop 5 lbs  10x bil Exercises: Seated forward fold 10 breaths  Therapeutic activities: Tall kneeling to standing position 6x Unilateral bent row + 5lbs 10x  Curtsy squat 10x bil Lateral lunges 10x bil   04/23/24 Neuromuscular re-education: Modified dead bug 10x bil Bird dog 10x bil Bear plank lifts 10x Modified side plank with clam shell 10x  Exercises: Cat cow 2 x 10 Seated piriformis stretch 30 sec bil Seated hamstring stretch 30 sec bil Therapeutic activities: 3 way kick 10x each, bil Standing shoulder extensions + green band 2 x 10 Pallof press + green band 10x bil Single leg  hip hinge 10x bil Single leg hip hinge with pelvic rotation 10x bil   03/11/24 Manual: Instrument assisted soft tissue mobilization to bil lumbar paraspinals and SIJs Neuromuscular re-education: Muscle energy techniques for Lt anterior/Rt posterior rotation in supine 5 rounds 10 second holds Shotgun technique 5 rounds Transversus abdominus training with multimodal cues for improved motor control and breath coordination Supine reverse table top heel taps 10x bil Modified supine dead bug  Exercises: Supine piriformis stretch 60 sec bil   PATIENT EDUCATION:  Education details: See above Person educated: Patient Education method: Programmer, multimedia, Demonstration, Tactile cues, Verbal cues, and Handouts Education comprehension: verbalized understanding  HOME EXERCISE PROGRAM: [id number]  ASSESSMENT:  CLINICAL IMPRESSION: Patient is a 38 y.o. female who was seen today for physical therapy evaluation and treatment for Rt upper abdominal pain and core weakness. Pt doing well getting to more exercises every day. She still has some low back pain, but feels like she can tell if she can continue exercises it will improve. We progressed supine exercises and standing exercises today to provide variety of activities that she can fit in throughout the day. She tolerated all exercises well, but has some increased weakness on Lt compared to Rt. She also required occasional cues to re-engage core to help keep pelvis stable. She will continue to benefit from skilled PT intervention in order to decrease Rt sided abdominal pain, improve core strength, increase symmetry throughout core and pelvis, and progress functional strengthening program.   OBJECTIVE IMPAIRMENTS: decreased activity tolerance, decreased coordination, decreased endurance, decreased mobility, decreased ROM, decreased strength, increased fascial restrictions, increased muscle spasms, impaired tone, postural dysfunction, and pain.   ACTIVITY  LIMITATIONS: lifting and reaching  PARTICIPATION LIMITATIONS: cleaning, laundry, interpersonal relationship, community activity, and occupation  PERSONAL FACTORS: 1 comorbidity: medical history are also affecting patient's functional outcome.   REHAB POTENTIAL: Good  CLINICAL DECISION MAKING: Stable/uncomplicated  EVALUATION COMPLEXITY: Low   GOALS: Goals reviewed with patient? Yes  SHORT TERM GOALS: Updated 04/23/24   Pt will be independent with HEP.   Baseline: Goal status: MET 04/23/24  2.  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  Baseline:  Goal status: MET 04/23/24  3.  Pt will demonstrate no Lt posterior pelvic rotation in order to help reduce Rt upper quadrant abdominal pain.  Baseline:  Goal status: IN PROGRESS 04/23/24  4.  Pt will demonstrate improved awareness of core bracing and be able to actively relax in order to decrease pain and help normalize posture.  Baseline:  Goal status: IN PROGRESS 04/23/24    LONG TERM GOALS: Updated 04/23/24  Pt will be independent with advanced HEP.   Baseline:  Goal status: IN PROGRESS 04/23/24  2.  Pt will report 75% improvement in pain in order to perform childcare without difficulty. Baseline:  Goal status: IN PROGRESS 04/23/24  3.  Pt will  be able to perform single leg stance on Lt without pelvic drop in order demonstrate stability that will prevent low back/pelvic pain.  Baseline:  Goal status: IN PROGRESS 04/23/24  4.  Pt will be able to perform curl-up test without abdominal distortion or difficulty in order to demonstrate improved core strength to decrease low back/pelvic pain.   Baseline:  Goal status: IN PROGRESS 04/23/24  PLAN:  PT FREQUENCY: 1-2x/week  PT DURATION: 6 months  PLANNED INTERVENTIONS: 97110-Therapeutic exercises, 97530- Therapeutic activity, 97112- Neuromuscular re-education, 97535- Self Care, [id number]- Manual therapy, Dry Needling, and  Biofeedback  PLAN FOR NEXT SESSION: Abdominal manual techniques; pelvic stability and core training activities; check thoracic mobility   Josette Mares, PT, DPT09/23/258:47 AM

## 2024-05-12 ENCOUNTER — Encounter

## 2024-05-19 ENCOUNTER — Encounter

## 2024-05-26 ENCOUNTER — Ambulatory Visit
